# Patient Record
Sex: Male | Born: 1965
Health system: Southern US, Community
[De-identification: ages and names within clinical notes are randomized; demographics above are authoritative.]

## PROBLEM LIST (undated history)

## (undated) DIAGNOSIS — E079 Disorder of thyroid, unspecified: Secondary | ICD-10-CM

## (undated) DIAGNOSIS — T783XXA Angioneurotic edema, initial encounter: Secondary | ICD-10-CM

## (undated) DIAGNOSIS — M1990 Unspecified osteoarthritis, unspecified site: Secondary | ICD-10-CM

## (undated) DIAGNOSIS — E162 Hypoglycemia, unspecified: Secondary | ICD-10-CM

## (undated) DIAGNOSIS — I10 Essential (primary) hypertension: Secondary | ICD-10-CM

## (undated) DIAGNOSIS — M545 Low back pain, unspecified: Secondary | ICD-10-CM

## (undated) DIAGNOSIS — T7840XA Allergy, unspecified, initial encounter: Secondary | ICD-10-CM

## (undated) HISTORY — DX: Angioneurotic edema, initial encounter: T78.3XXA

## (undated) HISTORY — DX: Low back pain, unspecified: M54.50

## (undated) HISTORY — PX: OTHER SURGICAL HISTORY: SHX169

## (undated) HISTORY — DX: Essential (primary) hypertension: I10

## (undated) HISTORY — DX: Low back pain: M54.5

## (undated) HISTORY — DX: Hypoglycemia, unspecified: E16.2

## (undated) HISTORY — DX: Allergy, unspecified, initial encounter: T78.40XA

## (undated) HISTORY — DX: Unspecified osteoarthritis, unspecified site: M19.90

---

## 1998-05-23 ENCOUNTER — Emergency Department (HOSPITAL_COMMUNITY): Admission: EM | Admit: 1998-05-23 | Discharge: 1998-05-23 | Payer: Self-pay | Admitting: Emergency Medicine

## 1999-04-18 ENCOUNTER — Emergency Department (HOSPITAL_COMMUNITY): Admission: EM | Admit: 1999-04-18 | Discharge: 1999-04-18 | Payer: Self-pay | Admitting: Emergency Medicine

## 1999-04-18 ENCOUNTER — Encounter: Payer: Self-pay | Admitting: Emergency Medicine

## 2001-07-03 ENCOUNTER — Emergency Department (HOSPITAL_COMMUNITY): Admission: EM | Admit: 2001-07-03 | Discharge: 2001-07-03 | Payer: Self-pay | Admitting: *Deleted

## 2002-03-11 ENCOUNTER — Emergency Department (HOSPITAL_COMMUNITY): Admission: EM | Admit: 2002-03-11 | Discharge: 2002-03-11 | Payer: Self-pay | Admitting: Emergency Medicine

## 2002-08-21 ENCOUNTER — Emergency Department (HOSPITAL_COMMUNITY): Admission: EM | Admit: 2002-08-21 | Discharge: 2002-08-21 | Payer: Self-pay | Admitting: Emergency Medicine

## 2003-01-13 ENCOUNTER — Encounter: Payer: Self-pay | Admitting: Emergency Medicine

## 2003-01-13 ENCOUNTER — Emergency Department (HOSPITAL_COMMUNITY): Admission: EM | Admit: 2003-01-13 | Discharge: 2003-01-13 | Payer: Self-pay | Admitting: Emergency Medicine

## 2003-08-10 ENCOUNTER — Emergency Department (HOSPITAL_COMMUNITY): Admission: EM | Admit: 2003-08-10 | Discharge: 2003-08-10 | Payer: Self-pay | Admitting: Emergency Medicine

## 2003-08-16 ENCOUNTER — Encounter: Admission: RE | Admit: 2003-08-16 | Discharge: 2003-09-06 | Payer: Self-pay | Admitting: Family Medicine

## 2003-09-08 ENCOUNTER — Encounter: Payer: Self-pay | Admitting: Family Medicine

## 2003-09-08 ENCOUNTER — Ambulatory Visit (HOSPITAL_COMMUNITY): Admission: RE | Admit: 2003-09-08 | Discharge: 2003-09-08 | Payer: Self-pay | Admitting: Family Medicine

## 2005-01-04 ENCOUNTER — Ambulatory Visit: Payer: Self-pay | Admitting: Family Medicine

## 2005-01-05 ENCOUNTER — Ambulatory Visit: Payer: Self-pay | Admitting: Family Medicine

## 2005-06-07 ENCOUNTER — Ambulatory Visit: Payer: Self-pay | Admitting: Family Medicine

## 2005-07-13 ENCOUNTER — Ambulatory Visit: Payer: Self-pay | Admitting: Family Medicine

## 2005-07-28 ENCOUNTER — Encounter (INDEPENDENT_AMBULATORY_CARE_PROVIDER_SITE_OTHER): Payer: Self-pay | Admitting: Specialist

## 2005-07-28 ENCOUNTER — Ambulatory Visit (HOSPITAL_COMMUNITY): Admission: RE | Admit: 2005-07-28 | Discharge: 2005-07-28 | Payer: Self-pay | Admitting: Surgery

## 2006-07-26 ENCOUNTER — Ambulatory Visit: Payer: Self-pay | Admitting: Family Medicine

## 2007-08-25 ENCOUNTER — Ambulatory Visit (HOSPITAL_COMMUNITY): Admission: RE | Admit: 2007-08-25 | Discharge: 2007-08-25 | Payer: Self-pay | Admitting: General Surgery

## 2007-08-25 ENCOUNTER — Encounter (HOSPITAL_BASED_OUTPATIENT_CLINIC_OR_DEPARTMENT_OTHER): Payer: Self-pay | Admitting: General Surgery

## 2008-01-03 ENCOUNTER — Ambulatory Visit: Payer: Self-pay | Admitting: Family Medicine

## 2008-01-03 DIAGNOSIS — M545 Low back pain: Secondary | ICD-10-CM

## 2008-01-03 DIAGNOSIS — S060X9A Concussion with loss of consciousness of unspecified duration, initial encounter: Secondary | ICD-10-CM | POA: Insufficient documentation

## 2008-01-03 DIAGNOSIS — I10 Essential (primary) hypertension: Secondary | ICD-10-CM

## 2008-01-03 DIAGNOSIS — S060XAA Concussion with loss of consciousness status unknown, initial encounter: Secondary | ICD-10-CM | POA: Insufficient documentation

## 2008-01-03 DIAGNOSIS — E162 Hypoglycemia, unspecified: Secondary | ICD-10-CM

## 2008-01-04 ENCOUNTER — Telehealth: Payer: Self-pay | Admitting: Family Medicine

## 2008-01-05 LAB — CONVERTED CEMR LAB
ALT: 22 units/L (ref 0–53)
AST: 19 units/L (ref 0–37)
Albumin: 4 g/dL (ref 3.5–5.2)
Alkaline Phosphatase: 47 units/L (ref 39–117)
BUN: 5 mg/dL — ABNORMAL LOW (ref 6–23)
Basophils Absolute: 0 10*3/uL (ref 0.0–0.1)
Basophils Relative: 0.3 % (ref 0.0–1.0)
Bilirubin, Direct: 0.1 mg/dL (ref 0.0–0.3)
CO2: 30 meq/L (ref 19–32)
Calcium: 9.6 mg/dL (ref 8.4–10.5)
Chloride: 105 meq/L (ref 96–112)
Creatinine, Ser: 1.1 mg/dL (ref 0.4–1.5)
Eosinophils Absolute: 0.2 10*3/uL (ref 0.0–0.6)
Eosinophils Relative: 2.7 % (ref 0.0–5.0)
GFR calc Af Amer: 94 mL/min
GFR calc non Af Amer: 78 mL/min
Glucose, Bld: 108 mg/dL — ABNORMAL HIGH (ref 70–99)
HCT: 42.6 % (ref 39.0–52.0)
Hemoglobin: 13.8 g/dL (ref 13.0–17.0)
Hgb A1c MFr Bld: 6.2 % — ABNORMAL HIGH (ref 4.6–6.0)
Lymphocytes Relative: 46.5 % — ABNORMAL HIGH (ref 12.0–46.0)
MCHC: 32.3 g/dL (ref 30.0–36.0)
MCV: 82.5 fL (ref 78.0–100.0)
Monocytes Absolute: 0.5 10*3/uL (ref 0.2–0.7)
Monocytes Relative: 7.3 % (ref 3.0–11.0)
Neutro Abs: 3.2 10*3/uL (ref 1.4–7.7)
Neutrophils Relative %: 43.2 % (ref 43.0–77.0)
Platelets: 218 10*3/uL (ref 150–400)
Potassium: 4 meq/L (ref 3.5–5.1)
RBC: 5.16 M/uL (ref 4.22–5.81)
RDW: 12.9 % (ref 11.5–14.6)
Sodium: 143 meq/L (ref 135–145)
TSH: 0.99 microintl units/mL (ref 0.35–5.50)
Total Bilirubin: 0.7 mg/dL (ref 0.3–1.2)
Total Protein: 6.9 g/dL (ref 6.0–8.3)
WBC: 7.4 10*3/uL (ref 4.5–10.5)

## 2008-06-04 ENCOUNTER — Telehealth: Payer: Self-pay | Admitting: Internal Medicine

## 2008-06-05 ENCOUNTER — Emergency Department (HOSPITAL_COMMUNITY): Admission: EM | Admit: 2008-06-05 | Discharge: 2008-06-05 | Payer: Self-pay | Admitting: Emergency Medicine

## 2008-06-12 ENCOUNTER — Ambulatory Visit: Payer: Self-pay | Admitting: Family Medicine

## 2008-06-12 DIAGNOSIS — M129 Arthropathy, unspecified: Secondary | ICD-10-CM | POA: Insufficient documentation

## 2008-06-13 ENCOUNTER — Ambulatory Visit: Payer: Self-pay | Admitting: Family Medicine

## 2008-06-17 ENCOUNTER — Telehealth: Payer: Self-pay | Admitting: Family Medicine

## 2008-06-17 LAB — CONVERTED CEMR LAB
ALT: 74 units/L — ABNORMAL HIGH (ref 0–53)
AST: 32 units/L (ref 0–37)
Albumin: 3.4 g/dL — ABNORMAL LOW (ref 3.5–5.2)
Alkaline Phosphatase: 44 units/L (ref 39–117)
Anti Nuclear Antibody(ANA): NEGATIVE
BUN: 10 mg/dL (ref 6–23)
Basophils Absolute: 0.1 10*3/uL (ref 0.0–0.1)
Basophils Relative: 1 % (ref 0.0–3.0)
Bilirubin, Direct: 0.1 mg/dL (ref 0.0–0.3)
CO2: 31 meq/L (ref 19–32)
CRP, High Sensitivity: 8 — ABNORMAL HIGH (ref 0.00–5.00)
Calcium: 8.8 mg/dL (ref 8.4–10.5)
Chloride: 102 meq/L (ref 96–112)
Creatinine, Ser: 1.1 mg/dL (ref 0.4–1.5)
Eosinophils Absolute: 0.4 10*3/uL (ref 0.0–0.7)
Eosinophils Relative: 4.2 % (ref 0.0–5.0)
GFR calc Af Amer: 94 mL/min
GFR calc non Af Amer: 78 mL/min
Glucose, Bld: 90 mg/dL (ref 70–99)
HCT: 39.4 % (ref 39.0–52.0)
Hemoglobin: 13.3 g/dL (ref 13.0–17.0)
Lymphocytes Relative: 23.2 % (ref 12.0–46.0)
MCHC: 33.6 g/dL (ref 30.0–36.0)
MCV: 81.4 fL (ref 78.0–100.0)
Monocytes Absolute: 0.5 10*3/uL (ref 0.1–1.0)
Monocytes Relative: 5.6 % (ref 3.0–12.0)
Neutro Abs: 5.7 10*3/uL (ref 1.4–7.7)
Neutrophils Relative %: 66 % (ref 43.0–77.0)
Platelets: 336 10*3/uL (ref 150–400)
Potassium: 3.8 meq/L (ref 3.5–5.1)
RBC: 4.84 M/uL (ref 4.22–5.81)
RDW: 12.6 % (ref 11.5–14.6)
Rheumatoid fact SerPl-aCnc: <20 intl units/mL — ABNORMAL LOW (ref 0.0–20.0)
Sed Rate: 9 mm/hr (ref 0–16)
Sodium: 140 meq/L (ref 135–145)
TSH: 1.2 microintl units/mL (ref 0.35–5.50)
Total Bilirubin: 0.5 mg/dL (ref 0.3–1.2)
Total Protein: 6.6 g/dL (ref 6.0–8.3)
WBC: 8.7 10*3/uL (ref 4.5–10.5)

## 2008-07-15 ENCOUNTER — Ambulatory Visit: Payer: Self-pay | Admitting: Family Medicine

## 2009-01-20 ENCOUNTER — Telehealth: Payer: Self-pay | Admitting: Family Medicine

## 2009-06-10 ENCOUNTER — Telehealth: Payer: Self-pay | Admitting: Family Medicine

## 2010-03-30 ENCOUNTER — Ambulatory Visit: Payer: Self-pay | Admitting: Family Medicine

## 2010-03-31 LAB — CONVERTED CEMR LAB
ALT: 21 units/L (ref 0–53)
AST: 22 units/L (ref 0–37)
Albumin: 4.3 g/dL (ref 3.5–5.2)
Alkaline Phosphatase: 44 units/L (ref 39–117)
BUN: 8 mg/dL (ref 6–23)
Basophils Absolute: 0.1 10*3/uL (ref 0.0–0.1)
Basophils Relative: 0.8 % (ref 0.0–3.0)
Bilirubin, Direct: 0.1 mg/dL (ref 0.0–0.3)
CO2: 30 meq/L (ref 19–32)
Calcium: 9.4 mg/dL (ref 8.4–10.5)
Chloride: 103 meq/L (ref 96–112)
Cholesterol: 208 mg/dL — ABNORMAL HIGH (ref 0–200)
Creatinine, Ser: 1.1 mg/dL (ref 0.4–1.5)
Direct LDL: 107.5 mg/dL
Eosinophils Absolute: 0.2 10*3/uL (ref 0.0–0.7)
Eosinophils Relative: 2.8 % (ref 0.0–5.0)
GFR calc non Af Amer: 90.49 mL/min (ref >60)
Glucose, Bld: 97 mg/dL (ref 70–99)
HCT: 42.6 % (ref 39.0–52.0)
HDL: 77.9 mg/dL (ref >39.00)
Hemoglobin: 14 g/dL (ref 13.0–17.0)
Lymphocytes Relative: 42.3 % (ref 12.0–46.0)
Lymphs Abs: 3.1 10*3/uL (ref 0.7–4.0)
MCHC: 32.8 g/dL (ref 30.0–36.0)
MCV: 82.8 fL (ref 78.0–100.0)
Monocytes Absolute: 0.5 10*3/uL (ref 0.1–1.0)
Monocytes Relative: 7 % (ref 3.0–12.0)
Neutro Abs: 3.4 10*3/uL (ref 1.4–7.7)
Neutrophils Relative %: 47.1 % (ref 43.0–77.0)
PSA: 0.64 ng/mL (ref 0.10–4.00)
Platelets: 226 10*3/uL (ref 150.0–400.0)
Potassium: 4 meq/L (ref 3.5–5.1)
RBC: 5.15 M/uL (ref 4.22–5.81)
RDW: 14.3 % (ref 11.5–14.6)
Sodium: 141 meq/L (ref 135–145)
TSH: 0.99 microintl units/mL (ref 0.35–5.50)
Total Bilirubin: 0.6 mg/dL (ref 0.3–1.2)
Total CHOL/HDL Ratio: 3
Total Protein: 7.6 g/dL (ref 6.0–8.3)
Triglycerides: 55 mg/dL (ref 0.0–149.0)
VLDL: 11 mg/dL (ref 0.0–40.0)
WBC: 7.3 10*3/uL (ref 4.5–10.5)

## 2010-05-14 ENCOUNTER — Ambulatory Visit: Payer: Self-pay | Admitting: Family Medicine

## 2010-05-14 DIAGNOSIS — T783XXA Angioneurotic edema, initial encounter: Secondary | ICD-10-CM | POA: Insufficient documentation

## 2010-06-08 ENCOUNTER — Telehealth: Payer: Self-pay | Admitting: Family Medicine

## 2010-06-09 ENCOUNTER — Telehealth: Payer: Self-pay | Admitting: Family Medicine

## 2010-06-10 ENCOUNTER — Ambulatory Visit: Payer: Self-pay | Admitting: Family Medicine

## 2010-07-06 ENCOUNTER — Encounter: Payer: Self-pay | Admitting: Family Medicine

## 2010-09-11 ENCOUNTER — Emergency Department (HOSPITAL_COMMUNITY): Admission: EM | Admit: 2010-09-11 | Discharge: 2010-09-11 | Payer: Self-pay | Admitting: Emergency Medicine

## 2010-12-15 NOTE — Assessment & Plan Note (Signed)
Summary: swelling of mouth/dm   Vital Signs:  Patient profile:   45 year old male Weight:      189 pounds Temp:     98.5 degrees F oral BP sitting:   140 / 108  (left arm) Cuff size:   regular  Vitals Entered By: Raechel Ache, RN (May 14, 2010 9:44 AM) CC: C/o lips swelling past 3 weeks @different  times. Taking Benadryl and it helps. Hands also swelling.   History of Present Illness: Here for 3 episodes in the past week of swelling in the lips. he had some mild hand swelling with one of these spells, but no SOB or wheezing. One time it happened after eating some avocado chips, one time after eating a can of fruit salad, and one time after having only coffee. No other new exposures that he knows of, no new medications. Each of these times he took some Benadryl, and the swelling promptly resolved.   Allergies: No Known Drug Allergies  Past History:  Past Medical History: Reviewed history from 07/15/2008 and no changes required. Hypertension hx of concussion Fx rt collarbone Low back pain non-rheumatoid inflammatory arthritis  Review of Systems  The patient denies anorexia, fever, weight loss, weight gain, vision loss, decreased hearing, hoarseness, chest pain, syncope, dyspnea on exertion, prolonged cough, headaches, hemoptysis, abdominal pain, melena, hematochezia, severe indigestion/heartburn, hematuria, incontinence, genital sores, muscle weakness, suspicious skin lesions, transient blindness, difficulty walking, depression, unusual weight change, abnormal bleeding, enlarged lymph nodes, angioedema, breast masses, and testicular masses.    Physical Exam  General:  Well-developed,well-nourished,in no acute distress; alert,appropriate and cooperative throughout examination Head:  Normocephalic and atraumatic without obvious abnormalities. No apparent alopecia or balding. Eyes:  No corneal or conjunctival inflammation noted. EOMI. Perrla. Funduscopic exam benign, without  hemorrhages, exudates or papilledema. Vision grossly normal. Ears:  External ear exam shows no significant lesions or deformities.  Otoscopic examination reveals clear canals, tympanic membranes are intact bilaterally without bulging, retraction, inflammation or discharge. Hearing is grossly normal bilaterally. Nose:  External nasal examination shows no deformity or inflammation. Nasal mucosa are pink and moist without lesions or exudates. Mouth:  Oral mucosa and oropharynx without lesions or exudates.  Teeth in good repair. Neck:  No deformities, masses, or tenderness noted. Lungs:  Normal respiratory effort, chest expands symmetrically. Lungs are clear to auscultation, no crackles or wheezes. Heart:  Normal rate and regular rhythm. S1 and S2 normal without gallop, murmur, click, rub or other extra sounds. Extremities:  no edema Skin:  Intact without suspicious lesions or rashes   Impression & Recommendations:  Problem # 1:  ANGIOEDEMA (ICD-995.1)  Complete Medication List: 1)  Flexeril 10 Mg Tabs (Cyclobenzaprine hcl) .... Three times a day as needed spasm 2)  Skelaxin 800 Mg Tabs (Metaxalone) .[name].. 1 every 6 hours as needed spasm 3)  Prednisone (pak) 10 Mg Tabs (Prednisone) .... As directed for 12 days  Patient Instructions: 1)  Given a 12 day prednisone taper pack. He will take Zyrtec two times a day for 30 days.  2)  Please schedule a follow-up appointment as needed .  Prescriptions: PREDNISONE (PAK) 10 MG TABS (PREDNISONE) as directed for 12 days  #1 x 0   Entered and Authorized by:   Nelwyn Salisbury MD   Signed by:   Nelwyn Salisbury MD on 05/14/2010   Method used:   Electronically to        [facility] [phone]* (retail)  [address]       [place], [place]  [address]       Ph: [phone]       Fax: [phone]   RxID:   [id number]

## 2010-12-15 NOTE — Assessment & Plan Note (Signed)
Summary: pts mouth is swelling/per Dr. Karalee Height   Vital Signs:  Patient profile:   45 year old male Weight:      190 pounds Temp:     98.1 degrees F oral BP sitting:   142 / 100  (left arm) Cuff size:   regular  Vitals Entered By: Raechel Ache, RN (June 10, 2010 10:44 AM) CC: Lips swollen again, finished prednisone and zyrtec.   History of Present Illness: For the past 6 weeks he has had recurrent swelling in his lips and face. No SOB or wheezing. We gave him a course of Prednisone, and this helped a lot. Now he is keeping it under control with Benadryl, but it will come back if he stops Benadryl for a day or two. He is on no other meds.   Allergies: No Known Drug Allergies  Past History:  Past Medical History: Reviewed history from 07/15/2008 and no changes required. Hypertension hx of concussion Fx rt collarbone Low back pain non-rheumatoid inflammatory arthritis  Review of Systems  The patient denies anorexia, fever, weight loss, weight gain, vision loss, decreased hearing, hoarseness, chest pain, syncope, dyspnea on exertion, peripheral edema, prolonged cough, headaches, hemoptysis, abdominal pain, melena, hematochezia, severe indigestion/heartburn, hematuria, incontinence, genital sores, muscle weakness, suspicious skin lesions, transient blindness, difficulty walking, depression, unusual weight change, abnormal bleeding, enlarged lymph nodes, angioedema, breast masses, and testicular masses.    Physical Exam  General:  Well-developed,well-nourished,in no acute distress; alert,appropriate and cooperative throughout examination Head:  Normocephalic and atraumatic without obvious abnormalities. No apparent alopecia or balding. Eyes:  No corneal or conjunctival inflammation noted. EOMI. Perrla. Funduscopic exam benign, without hemorrhages, exudates or papilledema. Vision grossly normal. Ears:  External ear exam shows no significant lesions or deformities.  Otoscopic  examination reveals clear canals, tympanic membranes are intact bilaterally without bulging, retraction, inflammation or discharge. Hearing is grossly normal bilaterally. Nose:  External nasal examination shows no deformity or inflammation. Nasal mucosa are pink and moist without lesions or exudates. Mouth:  Oral mucosa and oropharynx without lesions or exudates.  Teeth in good repair. Neck:  No deformities, masses, or tenderness noted. Lungs:  Normal respiratory effort, chest expands symmetrically. Lungs are clear to auscultation, no crackles or wheezes. Heart:  Normal rate and regular rhythm. S1 and S2 normal without gallop, murmur, click, rub or other extra sounds. Skin:  Intact without suspicious lesions or rashes   Impression & Recommendations:  Problem # 1:  ANGIOEDEMA (ICD-995.1)  Orders: Allergy Referral  (Allergy)  Complete Medication List: 1)  Flexeril 10 Mg Tabs (Cyclobenzaprine hcl) .... Three times a day as needed spasm 2)  Skelaxin 800 Mg Tabs (Metaxalone) .[name].. 1 every 6 hours as needed spasm  Patient Instructions: 1)  Stay on benadryl. Will refer to Allergy for testing.

## 2010-12-15 NOTE — Progress Notes (Signed)
Summary: wants ov tomorrow am  Phone Note Call from Patient Call back at Home Phone [phone] Call back at [phone]   Caller: Patient---live call Summary of Call: pt would like to be seen tomorrow am about his lips. refused his rx yesterday.  Initial call taken by: Warnell Forester,  June 09, 2010 9:34 AM  Follow-up for Phone Call        okay  Follow-up by: Nelwyn Salisbury MD,  June 09, 2010 1:24 PM  Additional Follow-up for Phone Call Additional follow up Details #1::        pt will come in tomorrow as instructed. Additional Follow-up by: Warnell Forester,  June 09, 2010 1:32 PM

## 2010-12-15 NOTE — Assessment & Plan Note (Signed)
Summary: cpx   Vital Signs:  Patient profile:   45 year old male Weight:      186 pounds Temp:     98.4 degrees F oral BP sitting:   144 / 104  (left arm) Cuff size:   regular  Vitals Entered By: Raechel Ache, RN (Mar 30, 2010 1:03 PM) CC: Wants prostate check.   History of Present Illness: 45 yr old male for a cpx. He feels fine and has no complaints.   Allergies (verified): No Known Drug Allergies  Past History:  Past Medical History: Reviewed history from 07/15/2008 and no changes required. Hypertension hx of concussion Fx rt collarbone Low back pain non-rheumatoid inflammatory arthritis  Past Surgical History: Reviewed history from 01/03/2008 and no changes required. Denies surgical history  Family History: Reviewed history from 07/11/2007 and no changes required. Family History Diabetes 1st degree relative Family History Hypertension Fam hx Breast CA  Social History: Reviewed history from 07/11/2007 and no changes required. Married Never Smoked Alcohol use-no Drug use-no  Review of Systems  The patient denies anorexia, fever, weight loss, weight gain, vision loss, decreased hearing, hoarseness, chest pain, syncope, dyspnea on exertion, peripheral edema, prolonged cough, headaches, hemoptysis, abdominal pain, melena, hematochezia, severe indigestion/heartburn, hematuria, incontinence, genital sores, muscle weakness, suspicious skin lesions, transient blindness, difficulty walking, depression, unusual weight change, abnormal bleeding, enlarged lymph nodes, angioedema, breast masses, and testicular masses.    Physical Exam  General:  Well-developed,well-nourished,in no acute distress; alert,appropriate and cooperative throughout examination Head:  Normocephalic and atraumatic without obvious abnormalities. No apparent alopecia or balding. Eyes:  No corneal or conjunctival inflammation noted. EOMI. Perrla. Funduscopic exam benign, without hemorrhages,  exudates or papilledema. Vision grossly normal. Ears:  External ear exam shows no significant lesions or deformities.  Otoscopic examination reveals clear canals, tympanic membranes are intact bilaterally without bulging, retraction, inflammation or discharge. Hearing is grossly normal bilaterally. Nose:  External nasal examination shows no deformity or inflammation. Nasal mucosa are pink and moist without lesions or exudates. Mouth:  Oral mucosa and oropharynx without lesions or exudates.  Teeth in good repair. Neck:  No deformities, masses, or tenderness noted. Chest Wall:  No deformities, masses, tenderness or gynecomastia noted. Lungs:  Normal respiratory effort, chest expands symmetrically. Lungs are clear to auscultation, no crackles or wheezes. Heart:  Normal rate and regular rhythm. S1 and S2 normal without gallop, murmur, click, rub or other extra sounds. Abdomen:  Bowel sounds positive,abdomen soft and non-tender without masses, organomegaly or hernias noted. Rectal:  No external abnormalities noted. Normal sphincter tone. No rectal masses or tenderness. Genitalia:  Testes bilaterally descended without nodularity, tenderness or masses. No scrotal masses or lesions. No penis lesions or urethral discharge. Prostate:  Prostate gland firm and smooth, no enlargement, nodularity, tenderness, mass, asymmetry or induration. Msk:  No deformity or scoliosis noted of thoracic or lumbar spine.   Pulses:  R and L carotid,radial,femoral,dorsalis pedis and posterior tibial pulses are full and equal bilaterally Extremities:  No clubbing, cyanosis, edema, or deformity noted with normal full range of motion of all joints.   Neurologic:  No cranial nerve deficits noted. Station and gait are normal. Plantar reflexes are down-going bilaterally. DTRs are symmetrical throughout. Sensory, motor and coordinative functions appear intact. Skin:  Intact without suspicious lesions or rashes Cervical Nodes:  No  lymphadenopathy noted Axillary Nodes:  No palpable lymphadenopathy Inguinal Nodes:  No significant adenopathy Psych:  Cognition and judgment appear intact. Alert and cooperative with normal attention span  and concentration. No apparent delusions, illusions, hallucinations   Impression & Recommendations:  Problem # 1:  HEALTH MAINTENANCE EXAM (ICD-V70.0)  Orders: UA Dipstick w/o Micro (automated)  (81003) Venipuncture (60454) TLB-Lipid Panel (80061-LIPID) TLB-BMP (Basic Metabolic Panel-BMET) (80048-METABOL) TLB-CBC Platelet - w/Differential (85025-CBCD) TLB-Hepatic/Liver Function Pnl (80076-HEPATIC) TLB-TSH (Thyroid Stimulating Hormone) (84443-TSH) TLB-PSA (Prostate Specific Antigen) (84153-PSA)  Complete Medication List: 1)  Flexeril 10 Mg Tabs (Cyclobenzaprine hcl) .... Three times a day as needed spasm 2)  Skelaxin 800 Mg Tabs (Metaxalone) .[name].. 1 every 6 hours as needed spasm  Patient Instructions: 1)  Get labs. Recheck the BP in one month Prescriptions: FLEXERIL 10 MG  TABS (CYCLOBENZAPRINE HCL) three times a day as needed spasm  #60 x 11   Entered and Authorized by:   Nelwyn Salisbury MD   Signed by:   Nelwyn Salisbury MD on 03/30/2010   Method used:   Electronically to        [facility] [id number]* (retail)       [address] [place], [place]  [address]       Ph: [phone]       Fax: [phone]   RxID:   [id number]

## 2010-12-15 NOTE — Progress Notes (Signed)
Summary: refill request  Phone Note Call from Patient Call back at [phone]   Caller: Patient Call For: Nelwyn Salisbury MD Reason for Call: Talk to Doctor Details for Reason: refill Summary of Call: patient is calling because he is now out of his rx for prednisone and zyrtec.  His lips are still swollen and he would like  a refill.  [facility].  Follow-up for Phone Call        refill a 12 day taper pack of 10 mg prednisone Follow-up by: Nelwyn Salisbury MD,  June 08, 2010 5:13 PM  Additional Follow-up for Phone Call Additional follow up Details #1::        Rx Called In Additional Follow-up by: Raechel Ache, RN,  June 08, 2010 5:16 PM

## 2010-12-15 NOTE — Consult Note (Signed)
Summary: [facility]  [facility]   Imported By: Maryln Gottron 08/04/2010 13:43:29  _____________________________________________________________________  External Attachment:    Type:   Image     Comment:   External Document

## 2010-12-28 ENCOUNTER — Telehealth: Payer: Self-pay | Admitting: *Deleted

## 2010-12-28 NOTE — Telephone Encounter (Signed)
Has a sinus infection. Would like an abx called to [facility].

## 2010-12-28 NOTE — Telephone Encounter (Signed)
Pt is asking for an antibiotic for a sinus infection, congestion, postnasal drainage and facial pain?? Cannot reach by either phone number.

## 2010-12-28 NOTE — Telephone Encounter (Signed)
Appt scheduled tomorrow

## 2010-12-28 NOTE — Telephone Encounter (Signed)
He would need to be seen

## 2010-12-29 ENCOUNTER — Ambulatory Visit (INDEPENDENT_AMBULATORY_CARE_PROVIDER_SITE_OTHER): Payer: 59 | Admitting: Family Medicine

## 2010-12-29 ENCOUNTER — Encounter: Payer: Self-pay | Admitting: Family Medicine

## 2010-12-29 VITALS — BP 144/98 | HR 98 | Temp 98.7°F | Ht 69.0 in | Wt 196.0 lb

## 2010-12-29 DIAGNOSIS — J329 Chronic sinusitis, unspecified: Secondary | ICD-10-CM

## 2010-12-29 MED ORDER — AZITHROMYCIN 250 MG PO TABS: ORAL_TABLET | ORAL | Status: AC

## 2010-12-29 NOTE — Progress Notes (Signed)
  Subjective:    Patient ID: William Novak, male    DOB: 1965/12/15, 45 y.o.   MRN: [id number]  HPI Here for one week of sinus pressure, PND, HA, ST, and a dry cough. No fever. Tried some Mucinex D.   Review of Systems  Constitutional: Negative.   HENT: Positive for congestion, sore throat, postnasal drip and sinus pressure.   Eyes: Negative.   Respiratory: Positive for cough.        Objective:   Physical Exam  Constitutional: He appears well-developed and well-nourished.  HENT:  Head: Normocephalic and atraumatic.  Right Ear: External ear normal.  Left Ear: External ear normal.  Nose: Nose normal.  Mouth/Throat: Oropharynx is clear and moist. No oropharyngeal exudate.  Eyes: Conjunctivae and EOM are normal. Pupils are equal, round, and reactive to light. Right eye exhibits no discharge. Left eye exhibits no discharge.  Neck: Normal range of motion. Neck supple.  Pulmonary/Chest: Effort normal and breath sounds normal. No respiratory distress. He has no wheezes. He has no rales. He exhibits no tenderness.  Lymphadenopathy:    He has no cervical adenopathy.          Assessment & Plan:  Has an early sinus infection

## 2011-03-30 NOTE — Op Note (Signed)
NAME:  William Novak, William Novak              ACCOUNT NO.:  [id number]   MEDICAL RECORD NO.:  [id number]          PATIENT TYPE:  AMB   LOCATION:  DAY                          FACILITY:  [facility]   PHYSICIAN:  Leonie Man, M.D.   DATE OF BIRTH:  [birth year]   DATE OF PROCEDURE:  08/25/2007  DATE OF DISCHARGE:                               OPERATIVE REPORT   PREOPERATIVE DIAGNOSIS:  1. Anal fistula.  2. Hemorrhoidal disease.   POSTOPERATIVE DIAGNOSIS:  1. Anal fistula.  2. Hemorrhoidal disease.   OPERATION/PROCEDURE:  Anal fistulotomy and hemorrhoidectomy.   SURGEON:  Leonie Man, M.D.   ASSISTANT:  OR tech.   ANESTHESIA:  General.   SPECIMENS:  To lab hemorrhoids   FINDINGS:  Superficial transsphincteric anal fistula and multiple  hemorrhoidal tags.   ESTIMATED BLOOD LOSS:  Minimal.   COMPLICATIONS:  None apparent.   DISPOSITION:  The patient returned to the PACU in excellent condition.   INDICATIONS:  The patient is a 45 year old man who is status post  drainage of a perianal abscess.  He has the fistula.  He comes to the  operating room now for anal fistulotomy.   DESCRIPTION OF PROCEDURE:  The patient is positioned in lithotomy  position following induction of satisfactory general anesthesia.  Positive identification of the patient as [name] is carried out.  Perianal tissues are prepped and draped to be included in a sterile  operative field.  I placed a probe into the fistula which is located at  the 8 o'clock axis with the patient in lithotomy and then injected  through a 18-gauge Angiocath some peroxide into the fistulous tract.  This showed its entry point at the mid testate line.  The probe was  reinserted and brought out through the internal opening and cut down on  the tract using electrocautery.  The tract was then scraped clean and  hemostasis obtained on the edges with electrocautery.  A running  marsupializing suture of 3-0 chromic catgut securing the  mucosal into  the base of the fistulous tract.   I then turned attention to multiple hemorrhoidal tags.  These were  infiltrated with 0.25% Marcaine with epinephrine.  These were excised  and closed with running sutures of 3-0 chromic catgut.  A total of three  of these were removed and forwarded for pathologic evaluation.  Sponge  and instrument and sharp counts were verified.  I placed a Xeroform  packing into the marsupialized fistula and I placed a Gelfoam roll  inside the anus for additional hemostasis.  Sterile dressing was placed  on the wound.  Anesthetic reversed and the patient removed from the  operating room to the recovery room in stable condition.  He tolerated  the procedure well.      Leonie Man, M.D.  Electronically Signed     PB/MEDQ  D:  08/25/2007  T:  08/26/2007  Job:  [id number]

## 2011-04-02 NOTE — Op Note (Signed)
NAME:  William Novak, William Novak              ACCOUNT NO.:  [id number]   MEDICAL RECORD NO.:  [id number]          PATIENT TYPE:  AMB   LOCATION:  DAY                          FACILITY:  [facility]   PHYSICIAN:  Wilmon Arms. Corliss Skains, M.D. DATE OF BIRTH:  Mar 24, 1966   DATE OF PROCEDURE:  07/28/2005  DATE OF DISCHARGE:                                 OPERATIVE REPORT   PREOPERATIVE DIAGNOSIS:  Prolapsing internal hemorrhoids.   POSTOPERATIVE DIAGNOSIS:  Prolapsing internal hemorrhoids.   PROCEDURE PERFORMED:  Procedure for prolapsing hemorrhoids (stapled  hemorrhoidpexy).   SURGEON:  Wilmon Arms. Tsuei, M.D.   ANESTHESIA:  General endotracheal.   INDICATIONS:  Patient is a 45 year old male who has had chronic problems  with hemorrhoids.  Over the last week, he has had problems with prolapsing  internal hemorrhoid.  He was having some bleeding.  He presented to the  clinic after a visit to the emergency department.  The patient did have some  loose external hemorrhoidal skin, but I felt that his main problem was the  prolapsing internal hemorrhoid.  Therefore, I recommended the stapled  hemorrhoidpexy procedure [facility]).  The patient consented to the procedure.   DESCRIPTION OF PROCEDURE:  Patient was brought to the operating room, where  proper identification was obtained.  After an adequate level of general  endotracheal anesthesia was obtained, the patient was positioned in a prone  jack-knife position.  Appropriate padding was placed.  His buttocks were  taped apart.  The perianal region was prepped with Betadine and draped in a  sterile fashion.  We then underwent a time-out to confirm the proper  procedure and the proper patient.  The bullet retractor was then inserted  into the rectum.  The patient had some loose internal hemorrhoidal tissue  but no other masses.  The [facility] kit was then opened.  The dilator was inserted  into the rectum.  The clear perianal retractor was then inserted.  The white  working retractor was inserted through the clear retractor, and a purse-  string suture was placed in a circumferential fashion.  2-0 Prolene was used  to create the purse-string.  Once the purse-string was completed, the  stapler was inserted.  The anvil was placed above the purse-string. The  purse-string suture was then tied down around the anvil.  The tails of the  suture were passed through the side ports of the stapler, and an air knot  was tied.  The stapler was then tightened down and held for approximately 30  seconds.  This stapler closed down to approximately 3 cm above the dentate  line.  The stapler was then fired, opened, and removed.  Hemorrhoidal tissue  was then removed from the stapler and sent for pathologic examination.  The  retractor was then reinserted.  No bleeding was noted from the staple line.  The rectum was then packed with  Gelfoam soaked in lidocaine jelly.  An ABD pad was placed.  The patient was  then turned back to a supine position, extubated, and brought to the  recovery room in stable condition in stable condition.  All sponge,  instrument, and needle counts were correct.      Wilmon Arms. Tsuei, M.D.  Electronically Signed     MKT/MEDQ  D:  07/28/2005  T:  07/28/2005  Job:  [id number]

## 2011-06-02 ENCOUNTER — Encounter: Payer: Self-pay | Admitting: Family Medicine

## 2011-06-02 ENCOUNTER — Ambulatory Visit (INDEPENDENT_AMBULATORY_CARE_PROVIDER_SITE_OTHER): Payer: 59 | Admitting: Family Medicine

## 2011-06-02 DIAGNOSIS — R079 Chest pain, unspecified: Secondary | ICD-10-CM

## 2011-06-03 ENCOUNTER — Encounter: Payer: Self-pay | Admitting: Family Medicine

## 2011-06-03 NOTE — Progress Notes (Signed)
  Subjective:    Patient ID: William Novak, male    DOB: 1966/03/15, 45 y.o.   MRN: [id number]  HPI Here for 2 episodes over the past 3 days of sharp left sided chest pains which are centered at the lower rib area. No recent trauma, but he has been very active at his job lately with heavy lifting. Both of these spells started suddenly while sitting still at home, each lasted about 2 minutes, and each stopped abruptly. No associated SOB or sweats or nausea. He has never felt this before.    Review of Systems  Constitutional: Negative.   Respiratory: Negative.   Cardiovascular: Positive for chest pain. Negative for palpitations and leg swelling.       Objective:   Physical Exam  Constitutional: He appears well-developed and well-nourished.  Cardiovascular: Normal rate, regular rhythm, normal heart sounds and intact distal pulses.  Exam reveals no gallop and no friction rub.   No murmur heard.      EKG normal  Pulmonary/Chest: Effort normal and breath sounds normal. No respiratory distress. He has no wheezes. He has no rales.       He is very tender over the left anterior lower ribs, and palpating this area reproduces his pain exactly          Assessment & Plan:  This is chest wall pain, probably a costal muscle strain. He will rest, use Motrin prn. Recheck prn

## 2011-06-28 ENCOUNTER — Telehealth: Payer: Self-pay | Admitting: Family Medicine

## 2011-06-28 NOTE — Telephone Encounter (Signed)
Refill request for Cyclobenzapr 10 mg take 1 po tid prn, pt last here on 06/02/11 and script last filled on 03/29/11.

## 2011-06-28 NOTE — Telephone Encounter (Signed)
Call in #[phone] with 11 rf 

## 2011-06-28 NOTE — Telephone Encounter (Signed)
Please call to [facility].

## 2011-06-29 MED ORDER — CYCLOBENZAPRINE HCL 5 MG PO TABS: 10.0000 mg | ORAL_TABLET | Freq: Three times a day (TID) | ORAL | Status: DC | PRN

## 2011-06-29 NOTE — Telephone Encounter (Signed)
Script sent e-scribe, pt aware.

## 2011-08-26 LAB — DIFFERENTIAL
Basophils Absolute: 0
Eosinophils Absolute: 0.2
Lymphocytes Relative: 46
Lymphs Abs: 2.9
Neutrophils Relative %: 42 — ABNORMAL LOW

## 2011-08-26 LAB — CBC
HCT: 41
Hemoglobin: 13.5
MCHC: 32.9
RBC: 5.05
RDW: 13.4

## 2011-10-09 ENCOUNTER — Emergency Department (HOSPITAL_COMMUNITY)
Admission: EM | Admit: 2011-10-09 | Discharge: 2011-10-09 | Disposition: A | Discharge status: Home / Self Care | Payer: 59 | Attending: Emergency Medicine | Admitting: Emergency Medicine

## 2011-10-09 ENCOUNTER — Encounter (HOSPITAL_COMMUNITY): Payer: Self-pay | Admitting: *Deleted

## 2011-10-09 DIAGNOSIS — M129 Arthropathy, unspecified: Secondary | ICD-10-CM | POA: Insufficient documentation

## 2011-10-09 DIAGNOSIS — L089 Local infection of the skin and subcutaneous tissue, unspecified: Secondary | ICD-10-CM | POA: Insufficient documentation

## 2011-10-09 DIAGNOSIS — L02419 Cutaneous abscess of limb, unspecified: Secondary | ICD-10-CM | POA: Insufficient documentation

## 2011-10-09 DIAGNOSIS — Z79899 Other long term (current) drug therapy: Secondary | ICD-10-CM | POA: Insufficient documentation

## 2011-10-09 DIAGNOSIS — IMO0002 Reserved for concepts with insufficient information to code with codable children: Secondary | ICD-10-CM

## 2011-10-09 DIAGNOSIS — I10 Essential (primary) hypertension: Secondary | ICD-10-CM | POA: Insufficient documentation

## 2011-10-09 MED ORDER — DOXYCYCLINE HYCLATE 100 MG PO CAPS: 100.0000 mg | ORAL_CAPSULE | Freq: Two times a day (BID) | ORAL | Status: AC

## 2011-10-09 MED ORDER — IBUPROFEN 800 MG PO TABS
800.0000 mg | ORAL_TABLET | Freq: Once | ORAL | Status: AC
  Administered 2011-10-09: 800 mg via ORAL
  Filled 2011-10-09: qty 1

## 2011-10-09 MED ORDER — LIDOCAINE-EPINEPHRINE 2 %-1:100000 IJ SOLN
INTRAMUSCULAR | Status: AC
  Filled 2011-10-09: qty 1

## 2011-10-09 NOTE — ED Provider Notes (Signed)
History     CSN: [id number] Arrival date & time: 10/09/2011  9:12 AM   First MD Initiated Contact with Patient 10/09/11 0940      Chief Complaint  Patient presents with  . Insect Bite    (Consider location/radiation/quality/duration/timing/severity/associated sxs/prior treatment) HPI  Patient presents to emergency department complaining of a red draining area on his right anterior thigh he states he first noted as a "bump" a few days ago but states that 2 days ago it "opened up and began to drain." Since then patient states the area continues to drain small amount of yellow orange discharge. Patient states the area is mildly tender to the touch and he has mild tenderness when flexing his thigh. Patient denies fevers or chills. Patient states "since it still draining I figured I needed to get it checked out and to make sure it wasn't a spider bite." Patient has not been taking anything for pain stating very little associated pain. Pain is aggravated by direct touch and flexing the thigh and improved with lying still. Symptoms were gradual onset and persistent but not changing  Past Medical History  Diagnosis Date  . Hypoglycemia   . Hypertension   . Arthritis     Generalized  . Low back pain   . Concussion   . Angioedema     Past Surgical History  Procedure Date  . Fistula removal   . Hemmorhoidectomy     No family history on file.  History  Substance Use Topics  . Smoking status: Never Smoker   . Smokeless tobacco: Not on file  . Alcohol Use: No      Review of Systems  All other systems reviewed and are negative.    Allergies  Review of patient's allergies indicates no known allergies.  Home Medications   Current Outpatient Rx  Name Route Sig Dispense Refill  . CETIRIZINE-PSEUDOEPHEDRINE 5-120 MG PO TB12 Oral Take 1 tablet by mouth as needed.      . CYCLOBENZAPRINE HCL 5 MG PO TABS Oral Take 2 tablets (10 mg total) by mouth 3 (three) times daily as needed.  90 tablet 11    BP 149/89  Pulse 90  Temp(Src) 99.1 F (37.3 C) (Oral)  Resp 18  SpO2 99%  Physical Exam  Nursing note and vitals reviewed. Constitutional: He is oriented to person, place, and time. He appears well-developed and well-nourished. No distress.  HENT:  Head: Normocephalic and atraumatic.  Eyes: Conjunctivae are normal.  Neck: Normal range of motion.  Cardiovascular: Normal rate.   Pulmonary/Chest: Effort normal.  Abdominal: Soft. He exhibits no distension. There is no tenderness.  Musculoskeletal: Normal range of motion. He exhibits edema and tenderness.       See skin exam. Mild tenderness to palpation around skin is ulceration the remainder of thigh completely nontender. No crepitus or tenderness to palpation of posterior or lateral thigh or area of thigh distal or proximal to be 2 x 2 centimeter area of redness. Full range of motion of thigh without pain.  Neurological: He is alert and oriented to person, place, and time.  Skin: Skin is warm and dry. No rash noted. He is not diaphoretic. No erythema.       2 x 2 centimeter area of faint erythema and firmness of right anterior thigh with mild tenderness to palpation in scant blood-tinged discharge in that area of 0.5 cm ulceration in the center. No crepitus.  Psychiatric: He has a normal mood and affect.  ED Course  Procedures (including critical care time)  PO ibuprofen  INCISION AND DRAINAGE Performed by: Jenness Corner Consent: Verbal consent obtained. Risks and benefits: risks, benefits and alternatives were discussed Type: abscess  Body area: right anterior thigh  Anesthesia: local infiltration  Local anesthetic: lidocaine 2% with epinephrine  Anesthetic total: 6 ml  Complexity: complex Blunt dissection to break up loculations  Drainage: blood tinged with scant purulent  Drainage amount: scant  Packing material: none  Patient tolerance: Patient tolerated the procedure well with no  immediate complications.     Labs Reviewed - No data to display No results found.   1. Abscess or cellulitis of thigh       MDM  2 x 2 centimeter area of firm erythema with mild tenderness to palpation with only scant discharge with incision and drainage with small area of associated cellulitis but no crepitus or signs or symptoms worrisome for necrotizing fasciitis or deeper infection. Proximal and distal to anterior thigh at site of infection are nontender as well as entire posterior and lateral thigh. Patient is afebrile and complaining of only mild pain with ROM of thigh.         Jenness Corner, PA 10/09/11 1033  Jenness Corner, [place] 10/09/11 1034

## 2011-10-09 NOTE — ED Provider Notes (Signed)
Medical screening examination/treatment/procedure(s) were performed by non-physician practitioner and as supervising physician I was immediately available for consultation/collaboration. Selisa Tensley, MD, FACEP   Sherma Vanmetre L Stephan Draughn, MD 10/09/11 1604 

## 2011-10-09 NOTE — ED Notes (Signed)
?   Bite to top of rt thigh, has ruptured and drained, has knot and is swollen

## 2012-03-01 ENCOUNTER — Encounter: Payer: Self-pay | Admitting: Family Medicine

## 2012-03-01 ENCOUNTER — Ambulatory Visit (INDEPENDENT_AMBULATORY_CARE_PROVIDER_SITE_OTHER): Payer: 59 | Admitting: Family Medicine

## 2012-03-01 VITALS — BP 124/80 | HR 83 | Temp 98.5°F | Wt 202.0 lb

## 2012-03-01 DIAGNOSIS — M546 Pain in thoracic spine: Secondary | ICD-10-CM

## 2012-03-01 MED ORDER — DICLOFENAC SODIUM 75 MG PO TBEC: 75.0000 mg | DELAYED_RELEASE_TABLET | Freq: Two times a day (BID) | ORAL | Status: DC

## 2012-03-01 NOTE — Progress Notes (Signed)
  Subjective:    Patient ID: Ronny Flurry, male    DOB: 1966-04-07, 46 y.o.   MRN: [id number]  HPI Here for 3 weeks of sharp pains in the upper back which radiate through the right shoulder and down the right upper arm. The right hand tingles at times. No recent trauma. Using Flexeril.    Review of Systems  Constitutional: Negative.   Musculoskeletal: Positive for back pain.       Objective:   Physical Exam  Constitutional: He appears well-developed and well-nourished.  Musculoskeletal:       Tender in the right lower cervical and right upper thoracic areas, tight in the right upper trapezius. The shoulder is fine           Assessment & Plan:  Add Diclofenac bid. He has already scheduled to see Dr. Burnis Kingfisher on 03-09-12

## 2012-07-21 ENCOUNTER — Encounter (HOSPITAL_COMMUNITY): Payer: Self-pay | Admitting: Pharmacy Technician

## 2012-07-24 ENCOUNTER — Encounter (HOSPITAL_COMMUNITY)
Admission: RE | Admit: 2012-07-24 | Discharge: 2012-07-24 | Disposition: A | Discharge status: Home / Self Care | Payer: 59 | Source: Ambulatory Visit | Attending: Physician Assistant | Admitting: Physician Assistant

## 2012-07-24 ENCOUNTER — Encounter (HOSPITAL_COMMUNITY): Payer: Self-pay

## 2012-07-24 ENCOUNTER — Encounter (HOSPITAL_COMMUNITY)
Admission: RE | Admit: 2012-07-24 | Discharge: 2012-07-24 | Disposition: A | Discharge status: Home / Self Care | Payer: 59 | Source: Ambulatory Visit | Attending: Orthopedic Surgery | Admitting: Orthopedic Surgery

## 2012-07-24 DIAGNOSIS — M5412 Radiculopathy, cervical region: Secondary | ICD-10-CM | POA: Diagnosis present

## 2012-07-24 LAB — COMPREHENSIVE METABOLIC PANEL
ALT: 27 U/L (ref 0–53)
AST: 22 U/L (ref 0–37)
Alkaline Phosphatase: 52 U/L (ref 39–117)
GFR calc Af Amer: 73 mL/min — ABNORMAL LOW (ref >90)
Glucose, Bld: 101 mg/dL — ABNORMAL HIGH (ref 70–99)
Potassium: 4.2 mEq/L (ref 3.5–5.1)
Sodium: 138 mEq/L (ref 135–145)
Total Protein: 7.8 g/dL (ref 6.0–8.3)

## 2012-07-24 LAB — CBC
HCT: 43 % (ref 39.0–52.0)
MCV: 81.6 fL (ref 78.0–100.0)
Platelets: 263 10*3/uL (ref 150–400)
RBC: 5.27 MIL/uL (ref 4.22–5.81)
WBC: 8.5 10*3/uL (ref 4.0–10.5)

## 2012-07-24 LAB — SURGICAL PCR SCREEN: Staphylococcus aureus: NEGATIVE

## 2012-07-24 NOTE — H&P (Signed)
William Novak 07/24/2012 10:12 AM Location: [facility] Patient #: [id number] DOB: 01/08/1966 Married / Language: English / Race: Black or African American Male   History of Present Illness(William Novak Dierdre Highman, PA-C; 07/24/2012 11:54 AM) The patient is a 46 year old male who comes in today for a preoperative History and Physical. The patient is scheduled for a cervical TDR vs ACDF C5-6 (for cervical radiculopathy) to be performed by Dr. Debria Garret D. Shon Baton, MD at [facility] on Wednesday, August 02, 2012 at 0830 .    Allergies(Lori W Randa Lynn; 07/24/2012 11:01 AM) No Known Drug Allergies. 03/09/2012   Family History(Ronesha Heenan J Mishael Krysiak, PA-C; 07/24/2012 1:40 PM) Congestive Heart Failure. sister Diabetes Mellitus. mother, grandmother mothers side and grandfather mothers side, mother and grandmother mothers side Drug / Alcohol Addiction. father Hypertension. grandmother mothers side, mother and grandmother mothers side Osteoarthritis. grandmother mothers side Depression. mother Liver Disease, Chronic. father Rheumatoid Arthritis. mother   Social History(Lillard Bailon J [facility], PA-C; 07/24/2012 1:40 PM) Alcohol use. current drinker; drinks beer, wine and hard liquor; only occasionally per week, former drinker Children. 3 Current work status. working full time Drug/Alcohol Rehab (Currently). no Drug/Alcohol Rehab (Previously). no Exercise. Exercises rarely; does other Illicit drug use. no Living situation. live with spouse Marital status. married Number of flights of stairs before winded. 4-5 Pain Contract. no Tobacco / smoke exposure. yes Tobacco use. never smoker   Medication History(Lori W Lamb; 07/24/2012 11:02 AM) Benadryl Allergy ( Oral prn) Specific dose unknown - Active. (prn) Norco (7.5-325MG  Tablet, 1 (one) Tablet Oral every eight hours, Taken starting 07/05/2012) Active. (called in to [facility] @ [phone]/dk/sjr) Flexeril (5MG  Tablet, 1 Oral  q 8hrs prn spasm) Active.   Past Surgical History(Kyleena Scheirer J [facility], PA-C; 07/24/2012 1:40 PM) Hemorrhoidectomy   Other Problems(Lavoris Canizales J Aahana Elza, PA-C; 07/24/2012 1:40 PM) No pertinent past medical history   Review of Systems(Somya Jauregui J Charelle Petrakis, PA-C; 07/24/2012 1:40 PM) General:Not Present- Chills, Fever, Night Sweats, Appetite Loss, Fatigue, Feeling sick, Weight Gain and Weight Loss. Skin:Not Present- Itching, Rash, Skin Color Changes, Ulcer, Psoriasis and Change in Hair or Nails. HEENT:Not Present- Sensitivity to light, Hearing problems, Nose Bleed and Ringing in the Ears. Neck:Not Present- Swollen Glands and Neck Mass. Respiratory:Not Present- Snoring, Chronic Cough, Bloody sputum and Dyspnea. Cardiovascular:Not Present- Shortness of Breath, Chest Pain, Swelling of Extremities, Leg Cramps and Palpitations. Gastrointestinal:Not Present- Bloody Stool, Heartburn, Abdominal Pain, Vomiting, Nausea and Incontinence of Stool. Male Genitourinary:Not Present- Blood in Urine, Frequency, Incontinence and Nocturia. Musculoskeletal:Present- Muscle Pain, Joint Swelling and Joint Pain. Not Present- Muscle Weakness, Joint Stiffness and Back Pain. Neurological:Present- Tingling and Numbness. Not Present- Burning, Tremor, Headaches and Dizziness. Psychiatric:Not Present- Anxiety, Depression and Memory Loss. Endocrine:Not Present- Cold Intolerance, Heat Intolerance, Excessive hunger and Excessive Thirst. Hematology:Not Present- Abnormal Bleeding, Anemia, Blood Clots and Easy Bruising.   Vitals(Lori W Lamb; 07/24/2012 11:04 AM) 07/24/2012 11:02 AM Weight: 203 lb Height: 69.5 in Body Surface Area: 2.12 m Body Mass Index: 29.55 kg/m Pulse: 78 (Regular) BP: 162/98 (Sitting, Left Arm, Standard)    Physical Exam(Nikka Hakimian J Tyshae Stair, PA-C; 07/24/2012 11:53 AM) The physical exam findings are as follows:   General General Appearance- pleasant. Not in acute distress. Orientation-  Oriented X3. Build & Nutrition- Well nourished and Well developed. Posture- Normal posture. Gait- Normal. Mental Status- Alert.   Integumentary General Characteristics:Surgical Scars- no surgical scar evidence of previous cervical surgery. Cervical Spine- Skin examination of the cervical spine is without deformity, skin lesions, lacerations or abrasions.   Head and  Neck Neck Global Assessment- supple. no lymphadenopathy and no nucchal rigidty.   Eye Pupil- Bilateral- Normal, Direct reaction to light normal, Equal and Regular. Motion- Bilateral- EOMI.   Chest and Lung Exam Auscultation: Breath sounds:- Clear.   Cardiovascular Auscultation:Rhythm- Regular rate and rhythm. Heart Sounds- Normal heart sounds.   Abdomen Palpation/Percussion:Palpation and Percussion of the abdomen reveal - Non Tender, No Rebound tenderness and Soft.   Peripheral Vascular Upper Extremity: Palpation:Radial pulse- Bilateral- 2+.   Neurologic Sensation:Upper Extremity- Left- sensation is intact in the upper extremity. Right- sensation is diminished in the upper extremity (C6 distribution). Reflexes:Biceps Reflex- Bilateral- 2+. Brachioradialis Reflex- Bilateral- 2+. Triceps Reflex- Bilateral- 2+. Babinski- Bilateral- Babinski not present. Clonus- Bilateral- clonus not present. Hoffman's Sign- Bilateral- Hoffman's sign not present.   Musculoskeletal Spine/Ribs/Pelvis Cervical Spine : Inspection and Palpation:Tenderness- no soft tissue tenderness to palpation and no bony tenderness to palpation. bony/soft tissue palpation of the cervical spine and shoulders does not recreate their typical pain. Strength and Tone: Strength:Deltoid- Bilateral- 5/5. Biceps- Bilateral- 5/5. Triceps- Bilateral- 5/5. Wrist Extension- Bilateral- 5/5. Hand Grip- Bilateral- 5/5. Heel walk- Bilateral- able to heel walk without difficulty. Toe Walk- Bilateral-  able to walk on toes without difficulty. Heel-Toe Walk- Bilateral- able to heel-toe walk without difficulty. ROM- Flexion- Mildly decreased and painful. Extension- Mildly decreased and painful. Left Rotation - Mildly decreased and painful. Right Rotation - Mildly decreased and painful. Pain:- neither flexion or extension is more painful than the other. Special Testing- axial compression test negative and cross chest impingement test negative. Non-Anatomic Signs- No non-anatomic signs present. Upper Extremity Range of Motion:- No truesholder pain with IR/ER of the shoulders.   Assessment & Plan(Lotus Santillo J [facility], PA-C; 07/24/2012 11:41 AM) Note: Unfortunately conservative measures consisting of observation, activity modification, physical therapy, oral pain medications and injections have failed to alleviate his symptoms and given the ongoing nature of his pain and the significant decrease in his overall quality of life he wishes to proceed with surgery. Risks/benefits/alternatives to the procedure/expectations following the procedure have been reviewed with the patient by Dr. Shon Baton. He understands these and that the goal of surgery is to reduce not eliminate his pain.   MRI of the cervical spine from May 2013 shows mild to moderate disc bulge without cord compression at C3-C4, C4-C5. There is severe bilateral foraminal narrowing at C5-C6 due to uncal vertebral osteophytes. No significant facet disease at 5-6. Mild changes at 6-7. At C7-T1 there is moderate to severe right foraminal stenosis. No cord signal changes.   He will complete his pre-op hospital requirements today at 1PM. He has not yet been fitted for an ASPEN collar but does have an appointment too be fitted.  All of her questions have been encouraged, addressed and answered. Plan, at this time is to proceed with surgery as scheduled.   Signed electronically by Gwinda Maine, PA-C (07/24/2012 1:40 PM)    Sharren Bridge Munar 06/19/2012 10:35 AM Location: [facility] Patient #: [id number] DOB: Dec 16, 1965 Married / Language: [place] / Race: Black or African American Male   History of Present Monico Hoar, MD; 06/19/2012 10:40 AM) The patient is a 46 year old male who presents with back pain.  Additional reason for visit:  Neck painis described as the following: The patient is seen today in referral from Dr. Shelle Iron. The patient reports symptoms involving the entire neck which began 1 year(s) ago. The symptoms began without any known injury. Symptoms include neck pain, muscle spasm, shoulder pain,  numbness and tingling. The pain radiates to the left arm and right arm. The patient describes the pain as sharp. Onset was gradual. The symptoms occur frequently.The patient describes their symptoms as moderate in severity.The patient does feel that the symptoms are worsening. Symptoms are exacerbated by neck movement. Symptoms are relieved by immobilization. Pertinent medical history includes low back pain. Prior to being seen today the patient was previously evaluated by a colleague. Past evaluation has included cervical spine x-rays and cervical spine MRI. Past treatment has included muscle relaxants, physical therapy and spinal injections.   Subjective Transcription(DAHARI Sheela Stack, MD; 06/23/2012 10:09 AM)  Consultation requested by Dr. Paula Libra for evaluation of neck and right arm pain. Orley is a very pleasant young man who about four and a half months ago started noticing significant neck and right arm pain. There was no previous injury or event that he can account for. Since the time of the onset of it he has been seeing Dr. Shelle Iron. He has had injections in his neck, physical therapy for his neck, medications and yet he continues to have significant loss of quality of life.    Allergies(DAHARI Sheela Stack, MD; 06/19/2012 10:40 AM) No Known Drug Allergies. 03/09/2012   Family  History(DAHARI D BROOKS, MD; 06/19/2012 10:40 AM) Congestive Heart Failure. sister Diabetes Mellitus. mother, grandmother mothers side and grandfather mothers side Drug / Alcohol Addiction. father Hypertension. grandmother mothers side Osteoarthritis. grandmother mothers side   Social History(DAHARI Sheela Stack, MD; 06/19/2012 10:40 AM) Alcohol use. current drinker; drinks beer, wine and hard liquor; only occasionally per week Children. 3 Current work status. working full time Drug/Alcohol Rehab (Currently). no Drug/Alcohol Rehab (Previously). no Exercise. Exercises rarely; does other Illicit drug use. no Living situation. live with spouse Marital status. married Number of flights of stairs before winded. 4-5 Pain Contract. no Tobacco / smoke exposure. yes Tobacco use. never smoker   Medication History(DAHARI D BROOKS, MD; 06/19/2012 10:40 AM) Cyclobenzaprine HCl (7.5MG  Tablet, Oral) Active. Diclofenac Sodium (75MG  Tablet DR, Oral) Active.   Past Surgical History(DAHARI Sheela Stack, MD; 06/19/2012 10:40 AM) Hemorrhoidectomy   Objective Transcription(DAHARI D BROOKS, MD; 06/23/2012 10:09 AM)  He is a pleasant gentleman who appears younger than his stated age. He is alert and oriented x3. He has pain with cervical spine range of motion. Positive Lhermitte's sign. He has 5/5 strength in the upper extremity. He does have decreased sensation in the C6 distribution on the right hand side. He has 2+ symmetrical deep tendon reflexes. Negative Babinski test. Negative Hoffman sign. No shortness of breath or chest pain. Abdomen is soft and nontender. No history of incontinence of bowel and bladder.    RADIOGRAPHS:  As the result of this on 03/31/12 a MRI of his neck was done. The MRI shows mild to moderate disc bulge without cord compression at C3-C4, C4-C5. There is severe bilateral foraminal narrowing at C5-C6 due to uncal vertebral osteophytes. No significant  facet disease at 5-6. Mild changes at 6-7. At C7-T1 there is moderate to severe right foraminal stenosis. Again, no cord signal changes.    I reviewed the MRI itself with the patient and I do agree with the radiologist findings.    Assessment & Plan(DAHARI D BROOKS, MD; 06/19/2012 11:18 AM) Pain, Cervical (723.1)  Cervical Disc Degeneration (722.4)   Assessments Transcription(DAHARI D BROOKS, MD; 06/23/2012 10:09 AM)  At this point in time, the patient has cervical spondylitic radiculopathy at C5-C6. We have discussed disc replacement surgery as well as  fusion surgery. I have reviewed the risks which include infection, bleeding, nerve damage, death, stroke, paralysis, failure to heal, need for further surgery, ongoing or worse pain, hardware complications, throat pain, swallowing difficulties, hoarseness in the voice, need for further surgery. All of his questions were addressed. I have given him some literature to review. He will review this and let me know how he would like to proceed.    Miscellaneous Transcription(DAHARI Sheela Stack, MD; 06/23/2012 10:09 AM)  Alvy Beal, MD/ljs    T: 06/21/12  D: 06/19/12    Signed electronically by Alvy Beal, MD (06/19/2012 11:33 AM)

## 2012-07-24 NOTE — Pre-Procedure Instructions (Signed)
19 William Novak  07/24/2012   Your procedure is scheduled on:  08/02/12  Report to [facility] at 630 AM.  Call this number if you have problems the morning of surgery: [phone]   Remember:   Do not eat food:After Midnight.   Take these medicines the morning of surgery with A SIP OF WATER: hydocodone   Do not wear jewelry, make-up or nail polish.  Do not wear lotions, powders, or perfumes. You may wear deodorant.  Do not shave 48 hours prior to surgery. Men may shave face and neck.  Do not bring valuables to the hospital.  Contacts, dentures or bridgework may not be worn into surgery.  Leave suitcase in the car. After surgery it may be brought to your room.  For patients admitted to the hospital, checkout time is 11:00 AM the day of discharge.   Patients discharged the day of surgery will not be allowed to drive home.  Name and phone number of your driver: family  Special Instructions: CHG Shower Use Special Wash: 1/2 bottle night before surgery and 1/2 bottle morning of surgery.   Please read over the following fact sheets that you were given: Pain Booklet, Coughing and Deep Breathing, MRSA Information and Surgical Site Infection Prevention

## 2012-08-01 MED ORDER — CEFAZOLIN SODIUM-DEXTROSE 2-3 GM-% IV SOLR
2.0000 g | INTRAVENOUS | Status: AC
  Administered 2012-08-02: 2 g via INTRAVENOUS
  Filled 2012-08-01 (×2): qty 50

## 2012-08-02 ENCOUNTER — Ambulatory Visit (HOSPITAL_COMMUNITY): Payer: 59

## 2012-08-02 ENCOUNTER — Ambulatory Visit (HOSPITAL_COMMUNITY): Payer: 59 | Admitting: Anesthesiology

## 2012-08-02 ENCOUNTER — Inpatient Hospital Stay (HOSPITAL_COMMUNITY): Payer: 59

## 2012-08-02 ENCOUNTER — Inpatient Hospital Stay (HOSPITAL_COMMUNITY)
Admission: RE | Admit: 2012-08-02 | Discharge: 2012-08-04 | DRG: 490 | Disposition: A | Discharge status: Home / Self Care | Payer: 59 | Source: Ambulatory Visit | Attending: Orthopedic Surgery | Admitting: Orthopedic Surgery

## 2012-08-02 ENCOUNTER — Encounter (HOSPITAL_COMMUNITY): Payer: Self-pay | Admitting: Anesthesiology

## 2012-08-02 ENCOUNTER — Encounter (HOSPITAL_COMMUNITY): Payer: Self-pay | Admitting: *Deleted

## 2012-08-02 ENCOUNTER — Encounter (HOSPITAL_COMMUNITY)
Admission: RE | Disposition: A | Discharge status: Home / Self Care | Payer: Self-pay | Source: Ambulatory Visit | Attending: Orthopedic Surgery

## 2012-08-02 DIAGNOSIS — M47812 Spondylosis without myelopathy or radiculopathy, cervical region: Principal | ICD-10-CM | POA: Diagnosis present

## 2012-08-02 DIAGNOSIS — M5412 Radiculopathy, cervical region: Secondary | ICD-10-CM | POA: Diagnosis present

## 2012-08-02 DIAGNOSIS — Z833 Family history of diabetes mellitus: Secondary | ICD-10-CM

## 2012-08-02 DIAGNOSIS — M5030 Other cervical disc degeneration, unspecified cervical region: Secondary | ICD-10-CM | POA: Diagnosis present

## 2012-08-02 DIAGNOSIS — Z8249 Family history of ischemic heart disease and other diseases of the circulatory system: Secondary | ICD-10-CM

## 2012-08-02 HISTORY — PX: ANTERIOR CERVICAL DECOMP/DISCECTOMY FUSION: SHX1161

## 2012-08-02 SURGERY — ANTERIOR CERVICAL DECOMPRESSION/DISCECTOMY FUSION 1 LEVEL
Anesthesia: General | Site: Spine Cervical | Wound class: Clean

## 2012-08-02 MED ORDER — DEXTROSE 5 % IV SOLN
INTRAVENOUS | Status: DC | PRN
  Administered 2012-08-02: 09:00:00 via INTRAVENOUS

## 2012-08-02 MED ORDER — PHENOL 1.4 % MT LIQD
1.0000 | OROMUCOSAL | Status: DC | PRN
  Filled 2012-08-02: qty 177

## 2012-08-02 MED ORDER — DEXAMETHASONE SODIUM PHOSPHATE 10 MG/ML IJ SOLN
10.0000 mg | Freq: Once | INTRAMUSCULAR | Status: DC
  Filled 2012-08-02: qty 1

## 2012-08-02 MED ORDER — LACTATED RINGERS IV SOLN
INTRAVENOUS | Status: DC | PRN
  Administered 2012-08-02 (×3): via INTRAVENOUS

## 2012-08-02 MED ORDER — NEOSTIGMINE METHYLSULFATE 1 MG/ML IJ SOLN
INTRAMUSCULAR | Status: DC | PRN
  Administered 2012-08-02: 5 mg via INTRAVENOUS

## 2012-08-02 MED ORDER — ONDANSETRON HCL 4 MG/2ML IJ SOLN: 4.0000 mg | Freq: Once | INTRAMUSCULAR | Status: DC | PRN

## 2012-08-02 MED ORDER — ONDANSETRON HCL 4 MG/2ML IJ SOLN: 4.0000 mg | INTRAMUSCULAR | Status: DC | PRN

## 2012-08-02 MED ORDER — GLYCOPYRROLATE 0.2 MG/ML IJ SOLN
INTRAMUSCULAR | Status: DC | PRN
  Administered 2012-08-02: .6 mg via INTRAVENOUS

## 2012-08-02 MED ORDER — LACTATED RINGERS IV SOLN
INTRAVENOUS | Status: DC
Start: 2012-08-02 — End: 2012-08-04
  Administered 2012-08-02 – 2012-08-04 (×3): via INTRAVENOUS

## 2012-08-02 MED ORDER — OXYCODONE HCL 5 MG PO TABS: 5.0000 mg | ORAL_TABLET | Freq: Once | ORAL | Status: DC | PRN

## 2012-08-02 MED ORDER — FENTANYL CITRATE 0.05 MG/ML IJ SOLN
INTRAMUSCULAR | Status: DC | PRN
  Administered 2012-08-02: 50 ug via INTRAVENOUS
  Administered 2012-08-02: 100 ug via INTRAVENOUS
  Administered 2012-08-02: 50 ug via INTRAVENOUS
  Administered 2012-08-02: 100 ug via INTRAVENOUS
  Administered 2012-08-02 (×2): 50 ug via INTRAVENOUS
  Administered 2012-08-02: 100 ug via INTRAVENOUS

## 2012-08-02 MED ORDER — MORPHINE SULFATE 2 MG/ML IJ SOLN
1.0000 mg | INTRAMUSCULAR | Status: DC | PRN
  Administered 2012-08-02 (×2): 2 mg via INTRAVENOUS
  Filled 2012-08-02 (×2): qty 1

## 2012-08-02 MED ORDER — PROPOFOL 10 MG/ML IV BOLUS
INTRAVENOUS | Status: DC | PRN
  Administered 2012-08-02: 200 mg via INTRAVENOUS

## 2012-08-02 MED ORDER — BUPIVACAINE-EPINEPHRINE PF 0.25-1:200000 % IJ SOLN
INTRAMUSCULAR | Status: AC
  Filled 2012-08-02: qty 30

## 2012-08-02 MED ORDER — SODIUM CHLORIDE 0.9 % IV SOLN: 250.0000 mL | INTRAVENOUS | Status: DC

## 2012-08-02 MED ORDER — LACTATED RINGERS IV SOLN: INTRAVENOUS | Status: DC

## 2012-08-02 MED ORDER — MENTHOL 3 MG MT LOZG
1.0000 | LOZENGE | OROMUCOSAL | Status: DC | PRN
  Filled 2012-08-02: qty 9

## 2012-08-02 MED ORDER — ACETAMINOPHEN 10 MG/ML IV SOLN
1000.0000 mg | Freq: Once | INTRAVENOUS | Status: AC
  Administered 2012-08-02: 1000 mg via INTRAVENOUS
  Filled 2012-08-02: qty 100

## 2012-08-02 MED ORDER — CEFAZOLIN SODIUM 1-5 GM-% IV SOLN
1.0000 g | Freq: Three times a day (TID) | INTRAVENOUS | Status: AC
  Administered 2012-08-02 (×2): 1 g via INTRAVENOUS
  Filled 2012-08-02 (×3): qty 50

## 2012-08-02 MED ORDER — OXYCODONE HCL 5 MG/5ML PO SOLN: 5.0000 mg | Freq: Once | ORAL | Status: DC | PRN

## 2012-08-02 MED ORDER — DEXAMETHASONE SODIUM PHOSPHATE 4 MG/ML IJ SOLN
4.0000 mg | Freq: Four times a day (QID) | INTRAMUSCULAR | Status: DC
  Administered 2012-08-02 – 2012-08-04 (×5): 4 mg via INTRAVENOUS
  Filled 2012-08-02 (×12): qty 1

## 2012-08-02 MED ORDER — HYDROMORPHONE HCL PF 1 MG/ML IJ SOLN: 0.2500 mg | INTRAMUSCULAR | Status: DC | PRN

## 2012-08-02 MED ORDER — PHENYLEPHRINE HCL 10 MG/ML IJ SOLN
INTRAMUSCULAR | Status: DC | PRN
  Administered 2012-08-02: 120 ug via INTRAVENOUS
  Administered 2012-08-02: 80 ug via INTRAVENOUS

## 2012-08-02 MED ORDER — SODIUM CHLORIDE 0.9 % IJ SOLN: 3.0000 mL | Freq: Two times a day (BID) | INTRAMUSCULAR | Status: DC

## 2012-08-02 MED ORDER — OXYCODONE HCL 5 MG PO TABS
10.0000 mg | ORAL_TABLET | ORAL | Status: DC | PRN
  Administered 2012-08-03: 5 mg via ORAL
  Administered 2012-08-03 – 2012-08-04 (×2): 10 mg via ORAL
  Filled 2012-08-02 (×3): qty 2
  Filled 2012-08-02: qty 1

## 2012-08-02 MED ORDER — THROMBIN 20000 UNITS EX KIT
PACK | CUTANEOUS | Status: DC | PRN
  Administered 2012-08-02: 09:00:00 via TOPICAL

## 2012-08-02 MED ORDER — ACETAMINOPHEN 10 MG/ML IV SOLN
INTRAVENOUS | Status: AC
  Filled 2012-08-02: qty 100

## 2012-08-02 MED ORDER — ZOLPIDEM TARTRATE 5 MG PO TABS: 5.0000 mg | ORAL_TABLET | Freq: Every evening | ORAL | Status: DC | PRN

## 2012-08-02 MED ORDER — VECURONIUM BROMIDE 10 MG IV SOLR
INTRAVENOUS | Status: DC | PRN
  Administered 2012-08-02: 2 mg via INTRAVENOUS
  Administered 2012-08-02: 4 mg via INTRAVENOUS
  Administered 2012-08-02: 2 mg via INTRAVENOUS

## 2012-08-02 MED ORDER — METHOCARBAMOL 500 MG PO TABS
500.0000 mg | ORAL_TABLET | Freq: Four times a day (QID) | ORAL | Status: DC | PRN
  Administered 2012-08-03 – 2012-08-04 (×2): 500 mg via ORAL
  Filled 2012-08-02 (×3): qty 1

## 2012-08-02 MED ORDER — BUPIVACAINE-EPINEPHRINE 0.25% -1:200000 IJ SOLN
INTRAMUSCULAR | Status: DC | PRN
  Administered 2012-08-02: 5 mL

## 2012-08-02 MED ORDER — ACETAMINOPHEN 10 MG/ML IV SOLN
1000.0000 mg | Freq: Four times a day (QID) | INTRAVENOUS | Status: AC
  Administered 2012-08-02 – 2012-08-03 (×3): 1000 mg via INTRAVENOUS
  Filled 2012-08-02 (×5): qty 100

## 2012-08-02 MED ORDER — THROMBIN 20000 UNITS EX SOLR
CUTANEOUS | Status: AC
  Filled 2012-08-02: qty 20000

## 2012-08-02 MED ORDER — DEXAMETHASONE 4 MG PO TABS
4.0000 mg | ORAL_TABLET | Freq: Four times a day (QID) | ORAL | Status: DC
  Administered 2012-08-03 (×2): 4 mg via ORAL
  Filled 2012-08-02 (×12): qty 1

## 2012-08-02 MED ORDER — ROCURONIUM BROMIDE 100 MG/10ML IV SOLN
INTRAVENOUS | Status: DC | PRN
  Administered 2012-08-02: 50 mg via INTRAVENOUS

## 2012-08-02 MED ORDER — ONDANSETRON HCL 4 MG/2ML IJ SOLN
INTRAMUSCULAR | Status: DC | PRN
  Administered 2012-08-02: 4 mg via INTRAVENOUS

## 2012-08-02 MED ORDER — MIDAZOLAM HCL 5 MG/5ML IJ SOLN
INTRAMUSCULAR | Status: DC | PRN
  Administered 2012-08-02: 2 mg via INTRAVENOUS
  Administered 2012-08-02 (×2): 1 mg via INTRAVENOUS

## 2012-08-02 MED ORDER — DEXTROSE 5 % IV SOLN: 500.0000 mg | Freq: Four times a day (QID) | INTRAVENOUS | Status: DC | PRN

## 2012-08-02 MED ORDER — DEXAMETHASONE SODIUM PHOSPHATE 4 MG/ML IJ SOLN
INTRAMUSCULAR | Status: DC | PRN
  Administered 2012-08-02: 10 mg via INTRAVENOUS

## 2012-08-02 MED ORDER — LIDOCAINE HCL (CARDIAC) 20 MG/ML IV SOLN
INTRAVENOUS | Status: DC | PRN
  Administered 2012-08-02: 60 mg via INTRAVENOUS

## 2012-08-02 MED ORDER — SODIUM CHLORIDE 0.9 % IJ SOLN: 3.0000 mL | INTRAMUSCULAR | Status: DC | PRN

## 2012-08-02 SURGICAL SUPPLY — 64 items
2.0 MILLING BIT ×1 IMPLANT
ADH SKN CLS APL DERMABOND .7 (GAUZE/BANDAGES/DRESSINGS)
BLADE SURG ROTATE 9660 (MISCELLANEOUS) IMPLANT
BUR EGG ELITE 4.0 (BURR) ×1 IMPLANT
BUR MATCHSTICK NEURO 3.0 LAGG (BURR) IMPLANT
CANISTER SUCTION 2500CC (MISCELLANEOUS) ×2 IMPLANT
CLOTH BEACON ORANGE TIMEOUT ST (SAFETY) ×2 IMPLANT
CLSR STERI-STRIP ANTIMIC 1/2X4 (GAUZE/BANDAGES/DRESSINGS) ×1 IMPLANT
CORDS BIPOLAR (ELECTRODE) ×2 IMPLANT
COVER SURGICAL LIGHT HANDLE (MISCELLANEOUS) ×3 IMPLANT
CRADLE DONUT ADULT HEAD (MISCELLANEOUS) ×2 IMPLANT
DERMABOND ADVANCED (GAUZE/BANDAGES/DRESSINGS)
DERMABOND ADVANCED .7 DNX12 (GAUZE/BANDAGES/DRESSINGS) IMPLANT
DISC INTVRTB LRG 6X (Neuro Prosthesis/Implant) ×1 IMPLANT
DISC PRODISC-C LRG 6MM (Neuro Prosthesis/Implant) ×2 IMPLANT
DRAPE C-ARM 42X72 X-RAY (DRAPES) ×2 IMPLANT
DRAPE POUCH INSTRU U-SHP 10X18 (DRAPES) ×2 IMPLANT
DRAPE SURG 17X23 STRL (DRAPES) ×1 IMPLANT
DRAPE U-SHAPE 47X51 STRL (DRAPES) ×2 IMPLANT
DRSG MEPILEX BORDER 4X4 (GAUZE/BANDAGES/DRESSINGS) IMPLANT
DURAPREP 26ML APPLICATOR (WOUND CARE) ×2 IMPLANT
ELECT COATED BLADE 2.86 ST (ELECTRODE) ×2 IMPLANT
ELECT REM PT RETURN 9FT ADLT (ELECTROSURGICAL) ×2
ELECTRODE REM PT RTRN 9FT ADLT (ELECTROSURGICAL) ×1 IMPLANT
GLOVE BIO SURGEON STRL SZ7.5 (GLOVE) ×1 IMPLANT
GLOVE BIOGEL PI IND STRL 6.5 (GLOVE) IMPLANT
GLOVE BIOGEL PI IND STRL 8 (GLOVE) IMPLANT
GLOVE BIOGEL PI IND STRL 8.5 (GLOVE) ×1 IMPLANT
GLOVE BIOGEL PI INDICATOR 6.5 (GLOVE)
GLOVE BIOGEL PI INDICATOR 8 (GLOVE) ×1
GLOVE BIOGEL PI INDICATOR 8.5 (GLOVE) ×1
GLOVE ECLIPSE 6.0 STRL STRAW (GLOVE) IMPLANT
GLOVE ECLIPSE 7.5 STRL STRAW (GLOVE) ×1 IMPLANT
GLOVE ECLIPSE 8.5 STRL (GLOVE) ×5 IMPLANT
GLOVE SURG SS PI 7.5 STRL IVOR (GLOVE) ×1 IMPLANT
GOWN PREVENTION PLUS XXLARGE (GOWN DISPOSABLE) ×4 IMPLANT
GOWN STRL NON-REIN LRG LVL3 (GOWN DISPOSABLE) ×2 IMPLANT
GOWN STRL REIN XL XLG (GOWN DISPOSABLE) ×1 IMPLANT
INSERTER TIP ×1 IMPLANT
KIT BASIN OR (CUSTOM PROCEDURE TRAY) ×2 IMPLANT
KIT ROOM TURNOVER OR (KITS) ×2 IMPLANT
NDL SPNL 18GX3.5 QUINCKE PK (NEEDLE) ×1 IMPLANT
NEEDLE 22X1 1/2 (OR ONLY) (NEEDLE) ×1 IMPLANT
NEEDLE SPNL 18GX3.5 QUINCKE PK (NEEDLE) ×2 IMPLANT
NS IRRIG 1000ML POUR BTL (IV SOLUTION) ×2 IMPLANT
PACK ORTHO CERVICAL (CUSTOM PROCEDURE TRAY) ×2 IMPLANT
PACK UNIVERSAL I (CUSTOM PROCEDURE TRAY) ×2 IMPLANT
PAD ARMBOARD 7.5X6 YLW CONV (MISCELLANEOUS) ×4 IMPLANT
PRODISC-C RETRAINER SCREW 3.5X14MM ×2 IMPLANT
SPONGE INTESTINAL PEANUT (DISPOSABLE) ×1 IMPLANT
SPONGE SURGIFOAM ABS GEL 100 (HEMOSTASIS) ×2 IMPLANT
STRIP CLOSURE SKIN 1/2X4 (GAUZE/BANDAGES/DRESSINGS) ×1 IMPLANT
SURGIFLO TRUKIT (HEMOSTASIS) IMPLANT
SUT MNCRL AB 3-0 PS2 18 (SUTURE) ×2 IMPLANT
SUT SILK 2 0 (SUTURE) ×2
SUT SILK 2-0 18XBRD TIE 12 (SUTURE) ×1 IMPLANT
SUT VIC AB 2-0 CT1 18 (SUTURE) ×2 IMPLANT
SYR BULB IRRIGATION 50ML (SYRINGE) ×2 IMPLANT
SYR CONTROL 10ML LL (SYRINGE) ×2 IMPLANT
TAPE CLOTH 4X10 WHT NS (GAUZE/BANDAGES/DRESSINGS) ×2 IMPLANT
TAPE UMBILICAL COTTON 1/8X30 (MISCELLANEOUS) ×2 IMPLANT
TOWEL OR 17X24 6PK STRL BLUE (TOWEL DISPOSABLE) ×2 IMPLANT
TOWEL OR 17X26 10 PK STRL BLUE (TOWEL DISPOSABLE) ×2 IMPLANT
WATER STERILE IRR 1000ML POUR (IV SOLUTION) ×1 IMPLANT

## 2012-08-02 NOTE — Progress Notes (Signed)
Utilization review completed.  

## 2012-08-02 NOTE — Anesthesia Postprocedure Evaluation (Signed)
  Anesthesia Post-op Note  Patient: William Novak  Procedure(s) Performed: Procedure(s) (LRB) with comments: ANTERIOR CERVICAL DECOMPRESSION/DISCECTOMY FUSION 1 LEVEL (N/A) - Cervical Total Disc Replacement Verses ACDF C5-C6   Patient Location: PACU  Anesthesia Type: General  Level of Consciousness: awake, alert  and oriented  Airway and Oxygen Therapy: Patient Spontanous Breathing and Patient connected to nasal cannula oxygen  Post-op Pain: mild  Post-op Assessment: Post-op Vital signs reviewed  Post-op Vital Signs: Reviewed  Complications: No apparent anesthesia complications

## 2012-08-02 NOTE — Transfer of Care (Signed)
Immediate Anesthesia Transfer of Care Note  Patient: William Novak  Procedure(s) Performed: Procedure(s) (LRB) with comments: ANTERIOR CERVICAL DECOMPRESSION/DISCECTOMY FUSION 1 LEVEL (N/A) - Cervical Total Disc Replacement Verses ACDF C5-C6   Patient Location: PACU  Anesthesia Type: General  Level of Consciousness: sedated, patient cooperative and responds to stimulation  Airway & Oxygen Therapy: Patient Spontanous Breathing and Patient connected to face mask oxygen  Post-op Assessment: Report given to PACU RN and Post -op Vital signs reviewed and stable  Post vital signs: Reviewed and stable  Complications: No apparent anesthesia complications

## 2012-08-02 NOTE — Preoperative (Signed)
Beta Blockers   Reason not to administer Beta Blockers:Not Applicable 

## 2012-08-02 NOTE — H&P (Signed)
H+P REVIEWED NO CHANGE IN CLINICAL EXAM 

## 2012-08-02 NOTE — Anesthesia Procedure Notes (Signed)
Procedure Name: Intubation Date/Time: 08/02/2012 8:42 AM Performed by: Wray Kearns A Pre-anesthesia Checklist: Patient identified, Timeout performed, Emergency Drugs available, Suction available and Patient being monitored Patient Re-evaluated:Patient Re-evaluated prior to inductionOxygen Delivery Method: Circle system utilized Preoxygenation: Pre-oxygenation with 100% oxygen Intubation Type: IV induction and Cricoid Pressure applied Ventilation: Mask ventilation without difficulty Laryngoscope Size: Mac and 4 Grade View: Grade I Tube type: Oral Tube size: 8.0 mm Number of attempts: 1 Airway Equipment and Method: Stylet Placement Confirmation: ETT inserted through vocal cords under direct vision,  breath sounds checked- equal and bilateral and positive ETCO2 Secured at: 23 cm Tube secured with: Tape Dental Injury: Teeth and Oropharynx as per pre-operative assessment

## 2012-08-02 NOTE — Anesthesia Preprocedure Evaluation (Signed)
Anesthesia Evaluation  Patient identified by MRN, date of birth, ID band Patient awake    Reviewed: Allergy & Precautions, H&P , NPO status , Patient's Chart, lab work & pertinent test results  Airway Mallampati: I TM Distance: >3 FB Neck ROM: Full    Dental  (+) Teeth Intact and Dental Advisory Given   Pulmonary  breath sounds clear to auscultation        Cardiovascular hypertension, Pt. on medications Rhythm:Regular Rate:Normal     Neuro/Psych    GI/Hepatic   Endo/Other    Renal/GU      Musculoskeletal   Abdominal   Peds  Hematology   Anesthesia Other Findings   Reproductive/Obstetrics                           Anesthesia Physical Anesthesia Plan  ASA: I  Anesthesia Plan: General   Post-op Pain Management:    Induction: Intravenous  Airway Management Planned: Oral ETT  Additional Equipment:   Intra-op Plan:   Post-operative Plan: Extubation in OR  Informed Consent: I have reviewed the patients History and Physical, chart, labs and discussed the procedure including the risks, benefits and alternatives for the proposed anesthesia with the patient or authorized representative who has indicated his/her understanding and acceptance.   Dental advisory given  Plan Discussed with: CRNA, Anesthesiologist and Surgeon  Anesthesia Plan Comments:         Anesthesia Quick Evaluation

## 2012-08-02 NOTE — Brief Op Note (Signed)
08/02/2012  12:00 PM  PATIENT:  William Novak  46 y.o. male  PRE-OPERATIVE DIAGNOSIS:  cervical radiculopathy   POST-OPERATIVE DIAGNOSIS:  cervical radiculopathy   PROCEDURE:  Procedure(s) (LRB) with comments: ANTERIOR CERVICAL DECOMPRESSION/DISCECTOMY FUSION 1 LEVEL (N/A) - Cervical Total Disc Replacement Verses ACDF C5-C6   SURGEON:  Surgeon(s) and Role:    * Venita Lick, MD - Primary  PHYSICIAN ASSISTANT:   ASSISTANTS: Norval Gable   ANESTHESIA:   none  EBL:  Total I/O In: 2050 [I.V.:2050] Out: 150 [Blood:150]  BLOOD ADMINISTERED:none  DRAINS: none   LOCAL MEDICATIONS USED:  MARCAINE     SPECIMEN:  No Specimen  DISPOSITION OF SPECIMEN:  N/A  COUNTS:  YES  TOURNIQUET:  * No tourniquets in log *  DICTATION: .Other Dictation: Dictation Number [id number]  PLAN OF CARE: Admit to inpatient   PATIENT DISPOSITION:  PACU - hemodynamically stable.

## 2012-08-02 NOTE — Progress Notes (Signed)
08/02/2012 2:16 PM Pt arrived from pacu,vss,oriented to unit. Call bell within reach. Will continue to monitor. William Novak

## 2012-08-03 ENCOUNTER — Encounter (HOSPITAL_COMMUNITY): Payer: Self-pay | Admitting: Orthopedic Surgery

## 2012-08-03 NOTE — Progress Notes (Signed)
Clinical Social Work  CSW received inappropriate referral for SNF placement. Per progression meeting with RN, patient does not need placement. CSW is signing off but available if needed.   [place], [place] [phone]

## 2012-08-03 NOTE — Progress Notes (Signed)
    Subjective: Procedure(s) (LRB): ANTERIOR CERVICAL DECOMPRESSION/DISCECTOMY FUSION 1 LEVEL (N/A) 1 Day Post-Op  Patient reports pain as 3 on 0-10 scale.  Reports decreased arm pain denies incisional neck pain   Positive void Negative bowel movement Positive flatus Negative chest pain or shortness of breath  Objective: Vital signs in last 24 hours: Temp:  [98 F (36.7 C)-98.7 F (37.1 C)] 98.4 F (36.9 C) (09/19 0417) Pulse Rate:  [74-118] 98  (09/19 0417) Resp:  [17-26] 21  (09/18 2220) BP: (115-153)/(60-84) 136/80 mmHg (09/19 0417) SpO2:  [95 %-100 %] 99 % (09/19 0417) Weight:  [101.6 kg (223 lb 15.8 oz)] 101.6 kg (223 lb 15.8 oz) (09/18 1413)  Intake/Output from previous day: 09/18 0701 - 09/19 0700 In: 3840 [I.V.:3840] Out: 4145 [Urine:3995; Blood:150]  Labs: No results found for this basename: WBC:2,RBC:2,HCT:2,PLT:2 in the last 72 hours No results found for this basename: NA:2,K:2,CL:2,CO2:2,BUN:2,CREATININE:2,GLUCOSE:2,CALCIUM:2 in the last 72 hours No results found for this basename: LABPT:2,INR:2 in the last 72 hours  Physical Exam: Neurologically intact ABD soft Neurovascular intact Incision: dressing C/D/I and no drainage No cellulitis present Compartment soft no stridor or sqwelling at incision site  Assessment/Plan: Patient stable  xrays satisfactory Mobilization with physical therapy Encourage incentive spirometry Continue care  Advance diet Up with therapy Plan for discharge tomorrow [facility] for transfer to floor.  Patient with history of pulmonary issue post-op.  Because of cncern for angioedema and airway compromise will keep another 24 hrs for observation.  Patient is alert and stable - ok for transfer to regular floor.  Venita Lick, MD [facility] [phone]

## 2012-08-03 NOTE — Progress Notes (Signed)
Patient being transferred to [facility] via wheelchair. Phone report has been called Kristi,rn. Patient and wife are aware of the transfer.

## 2012-08-03 NOTE — Evaluation (Signed)
Physical Therapy Evaluation Patient Details Name: MAHMUD KEITHLY MRN: [id number] DOB: 10-07-1966 Today's Date: 08/03/2012 Time: 9629-5284 PT Time Calculation (min): 19 min  PT Assessment / Plan / Recommendation Clinical Impression  Pt s/p ACDF C5-6. Pt is at his baseline functional level. All education complete, no acute PT needs. Will not follow    PT Assessment  Patent does not need any further PT services    Follow Up Recommendations  No PT follow up       Equipment Recommendations  None recommended by PT          Precautions / Restrictions Precautions Precautions: Cervical Required Braces or Orthoses: Cervical Brace Cervical Brace: Soft collar;Applied in supine position (pt has both collars; order for soft collar) Restrictions Weight Bearing Restrictions: No   Pertinent Vitals/Pain Pt with no pain complaints.       Mobility  Bed Mobility Bed Mobility: Rolling Right;Right Sidelying to Sit;Sitting - Scoot to Edge of Bed Rolling Right: 7: Independent Right Sidelying to Sit: 7: Independent Sitting - Scoot to Edge of Bed: 7: Independent Transfers Transfers: Sit to Stand;Stand to Sit Sit to Stand: 7: Independent Stand to Sit: 7: Independent Ambulation/Gait Ambulation/Gait Assistance: 5: Supervision Ambulation Distance (Feet): 250 Feet Assistive device: None Ambulation/Gait Assistance Details: Cueing throughout to maintain cervical precautions while turning to talk to someone or look for an object Gait Pattern: Within Functional Limits Gait velocity: normal gait speed     Visit Information  Last PT Received On: 08/03/12 Assistance Needed: +1 PT/OT Co-Evaluation/Treatment: Yes    Subjective Data      Prior Functioning  Home Living Lives With: Spouse Available Help at Discharge: Family;Available 24 hours/day Type of Home: Apartment Home Access: Level entry Home Layout: One level Bathroom Shower/Tub: Tub/shower unit;Curtain Firefighter: Handicapped  height Bathroom Accessibility: Yes How Accessible: Accessible via walker Home Adaptive Equipment: None Prior Function Level of Independence: Independent Able to Take Stairs?: Yes Driving: Yes 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 Communication Communication: No difficulties Dominant Hand: Right    Cognition  Overall Cognitive Status: Appears within functional limits for tasks assessed/performed Arousal/Alertness: Awake/alert Orientation Level: Appears intact for tasks assessed Behavior During Session: [facility] for tasks performed    Extremity/Trunk Assessment Right Lower Extremity Assessment RLE ROM/Strength/Tone: Within functional levels RLE Sensation: WFL - Light Touch Left Lower Extremity Assessment LLE ROM/Strength/Tone: Within functional levels LLE Sensation: WFL - Light Touch   Balance Balance Balance Assessed: No  End of Session PT - End of Session Equipment Utilized During Treatment: Gait belt;Cervical collar Activity Tolerance: Patient tolerated treatment well Patient left: in chair;with call bell/phone within reach;with family/visitor present Nurse Communication: Mobility status    Milana Kidney 08/03/2012, 1:07 PM

## 2012-08-03 NOTE — Evaluation (Signed)
Occupational Therapy Evaluation Patient Details Name: William Novak MRN: [id number] DOB: 08/01/1966 Today's Date: 08/03/2012 Time: 7829-5621 OT Time Calculation (min): 21 min  OT Assessment / Plan / Recommendation Clinical Impression  Pt s/p ACDF. All education completed and pt to have all necessary assist at d/c. No further acute OT needs at this time. Will sign off.    OT Assessment  Patient does not need any further OT services    Follow Up Recommendations  No OT follow up    Barriers to Discharge      Equipment Recommendations  None recommended by OT;None recommended by PT    Recommendations for Other Services    Frequency       Precautions / Restrictions Precautions Precautions: Cervical Required Braces or Orthoses: Cervical Brace Cervical Brace: Soft collar;Applied in supine position (pt has both hard and soft collar) Restrictions Weight Bearing Restrictions: No   Pertinent Vitals/Pain Pt denies any pain at this time    ADL  Eating/Feeding: Simulated;Modified independent Where Assessed - Eating/Feeding: Chair Grooming: Performed;Modified independent Where Assessed - Grooming: Unsupported sitting Upper Body Bathing: Simulated;Independent Where Assessed - Upper Body Bathing: Unsupported standing Lower Body Bathing: Simulated;Supervision/safety Where Assessed - Lower Body Bathing: Unsupported standing Upper Body Dressing: Simulated;Modified independent Where Assessed - Upper Body Dressing: Unsupported sitting Lower Body Dressing: Performed;Modified independent (cross ankles over knees to avoid bending down to feet) Where Assessed - Lower Body Dressing: Unsupported sit to stand Toilet Transfer: Performed;Independent Toilet Transfer Method: Sit to Barista: Regular height toilet Toileting - Clothing Manipulation and Hygiene: Simulated;Independent Where Assessed - Toileting Clothing Manipulation and Hygiene: Sit to stand from 3-in-1 or  toilet Tub/Shower Transfer: Simulated;Modified independent (steady self on wall) Tub/Shower Transfer Method: Ambulating Equipment Used: Gait belt Transfers/Ambulation Related to ADLs: I with all ambulation ADL Comments: Pt and wife educated on collar wear (hard when OOB, soft in bed unless otherwise told by MD), collar adjustments, pad changing and cleaning, adaptations for eating and face care. Lifting, pushing/pulling and bending restrictions as well as cervical precautions. No further needs indicated    OT Diagnosis:    OT Problem List:   OT Treatment Interventions:     OT Goals    Visit Information  Last OT Received On: 08/03/12 Assistance Needed: +1    Subjective Data  Subjective: I'm doing better than I thought. 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 Communication: No difficulties Dominant Hand: Right      Cognition  Overall Cognitive Status: Appears within functional limits for tasks assessed/performed Arousal/Alertness: Awake/alert Orientation Level: Appears intact for tasks assessed Behavior During Session: [facility] for tasks performed    Extremity/Trunk Assessment Right Upper Extremity Assessment RUE ROM/Strength/Tone: Within functional levels RUE Sensation: WFL - Light Touch RUE Coordination: WFL - gross/fine motor Left Upper Extremity Assessment LUE ROM/Strength/Tone: Within functional levels LUE Sensation: WFL -  Light Touch LUE Coordination: WFL - gross/fine motor   Mobility  Shoulder Instructions  Bed Mobility Bed Mobility: Rolling Right;Right Sidelying to Sit;Sitting - Scoot to Edge of Bed Rolling Right: 7: Independent Right Sidelying  to Sit: 7: Independent Sitting - Scoot to Edge of Bed: 7: Independent Transfers Sit to Stand: 7: Independent Stand to Sit: 7: Independent       Exercise     Balance     End of Session  Pt left in chair with wife at end of session with call bell in reach  GO     Jackey Housey 08/03/2012, 3:28 PM

## 2012-08-03 NOTE — Op Note (Signed)
NAMEMarland Novak  KHAMANI, FAIRLEY NO.:  [id number]  MEDICAL RECORD NO.:  [id number]  LOCATION:  3314                         FACILITY:  [facility]  PHYSICIAN:  Alvy Beal, MD    DATE OF BIRTH:  [birth year]  DATE OF PROCEDURE:  08/02/2012 DATE OF DISCHARGE:                              OPERATIVE REPORT   PREOPERATIVE DIAGNOSIS:  Cervical spondylotic radiculopathy.  POSTOPERATIVE DIAGNOSIS:  Cervical spondylotic radiculopathy.  OPERATIVE PROCEDURE:  Total disk arthroplasty, C5-6.  INSTRUMENTATION USED:  ProDisc-C.  COMPLICATIONS:  None.  SIZE OF THE IMPLANT:  Size 6, large.  HISTORY:  This is a very pleasant gentleman who has been having significant neck and radicular arm pain.  Despite appropriate conservative management, his overall quality of life has continued to suffer.  As a result of the failure of nonoperative management, we elected to proceed with surgery.  All appropriate risks, benefits, and alternatives were discussed with the patient and consent was obtained.  OPERATIVE NOTE:  The patient was brought to the operating room, placed supine on the operating table.  After successful induction of general anesthesia and endotracheal intubation, TEDs, SCDs were applied.  The patient was secured to the table.  Arms were secured over at each sides. The shoulders were taped down and inflatable cuff was placed underneath the shoulders.  It was expanded to allow his neck proper positioning for anterior cervical approach.  The neck was then prepped and draped in a standard fashion.  Appropriate time-out was then taken to confirm the patient, procedure, and all other pertinent important data.  X-ray was then brought into the field.  I identified the C5-6 space and then infiltrated a transverse incision site starting at the midline and proceeding to the left.  About an inch and half incision was made and I sharply dissected down to the deep fascia as well as the  platysma. Platysma was sharply incised and then I began mobilizing the deep cervical and prevertebral fascia with finger.  I then swept the omohyoid medially and placed a retractor to retract the esophagus and the trachea.  I then palpated the carotid sheath and protected it with a finger laterally.  I then used Kittner dissectors to remove the remainder of the prevertebral fascia to expose the anterior longitudinal ligament.  Once this was done, I placed a needle into the C5-6 disk space, took an x-ray and confirmed that I was at the appropriate level. Once this was done, I then mobilized the longus coli muscles with bipolar electrocautery from the midbody of C5 to the midbody of C6. Self-retaining retractors were placed underneath the longus coli muscle. The endotracheal cuff was deflated.  I expanded the retractors to the appropriate width and then reinflated the cuff.  I then used a 15-blade scalpel to perform an annulotomy at C5-6 and then used the combination of pituitary rongeurs, curettes, and Kerrison rongeurs to remove the bulk of the disk material.  At this point in the AP plane, I identified the midportion of the cervical body based on the spinous process.  I then placed an awl at this spot and a distraction pin into this level. I then repeated that at C6.  In the lateral plane, I confirmed that the distraction pins were parallel to the respective endplates.  Once I had this properly positioned, I then distracted the interbody space and secured the distraction to maintain it.  At this point, I then continued with micro-nerve curettes to remove the posterior anulus and released it from the posterior aspect of the vertebral bodies.  I then used a series of micro-nerve hooks to gently make a plane underneath the posterior longitudinal ligament.  I then used a 1 mm Kerrison to resect the entire posterior longitudinal ligament.  At this point, I could now sweep the nerve hook  underneath the vertebral bodies of C5 and C6.  I had adequately removed the posterior longitudinal ligament and completely decompressed the area.  At this point, I confirmed that I had removed all of the cartilaginous endplate from the subchondral bone to expose the subchondral bone.  I then trialed with sequential trial implants and elected to use the 6 large implant.  I confirmed proper positioning in both planes.  Once it was properly seated to the correct depth, I then secured the targeting device over it and then used the reaming rotary drill bur to ream out the keel cuts.  Once both keel cuts were made, I removed the trial implant and then cleaned out all of the keel cuts.  At this point, I then obtained the 6 large implant and malleted to the appropriate depth.  I confirmed satisfactory position in both planes.  I removed the inserting devices, irrigated copiously with normal saline, and then removed the distraction pins.  I then made sure I had hemostasis using bone wax to seal the distraction pinholes and bipolar electrocautery to provide hemostasis for the soft tissue.  At this point, once I had complete hemostasis, I returned the trachea and esophagus to midline, and then closed the platysma with interrupted 2-0 Vicryl sutures.  A 3-0 Monocryl was used for the skin.  Steri-Strips and dry dressing were applied.  According to Anesthesia, the patient had issues in the past with angioedema and questionable flash pulmonary edema.  Given the fact that we retracting the cervical spine for approximately 2.5-3 hours with this surgical procedure, I did recommend a Step-Down Unit admission rather than regular floor admission.  This would allow more close observation and should airway compromise developed, he will be in a more secure location to have it determined.     Alvy Beal, MD     DDB/MEDQ  D:  08/02/2012  T:  08/03/2012  Job:  [id number]

## 2012-08-04 MED ORDER — ONDANSETRON HCL 4 MG PO TABS: 4.0000 mg | ORAL_TABLET | Freq: Three times a day (TID) | ORAL | Status: DC | PRN

## 2012-08-04 MED ORDER — MENTHOL 3 MG MT LOZG: 1.0000 | LOZENGE | OROMUCOSAL | Status: DC | PRN

## 2012-08-04 MED ORDER — CYCLOBENZAPRINE HCL 5 MG PO TABS: 10.0000 mg | ORAL_TABLET | Freq: Three times a day (TID) | ORAL | Status: DC | PRN

## 2012-08-04 MED ORDER — OXYCODONE-ACETAMINOPHEN 10-325 MG PO TABS: 1.0000 | ORAL_TABLET | Freq: Four times a day (QID) | ORAL | Status: DC | PRN

## 2012-08-04 NOTE — Progress Notes (Signed)
    Subjective: Procedure(s) (LRB): ANTERIOR CERVICAL DECOMPRESSION/DISCECTOMY FUSION 1 LEVEL (N/A) 2 Days Post-Op  Patient reports pain as 2 on 0-10 scale.  Reports decreased arm pain denies incisional neck pain   POSITIVE void Negative bowel movement Positive flatus Negative chest pain or shortness of breath  Objective: Vital signs in last 24 hours: Temp:  [97.5 F (36.4 C)-98.5 F (36.9 C)] 97.5 F (36.4 C) (09/20 0700) Pulse Rate:  [86-106] 86  (09/20 0700) Resp:  [18] 18  (09/20 0700) BP: (141-152)/(72-89) 146/79 mmHg (09/20 0700) SpO2:  [95 %-100 %] 95 % (09/20 0700)  Intake/Output from previous day: 09/19 0701 - 09/20 0700 In: 940 [P.O.:600; I.V.:340] Out: 1000 [Urine:1000]  Labs: No results found for this basename: WBC:2,RBC:2,HCT:2,PLT:2 in the last 72 hours No results found for this basename: NA:2,K:2,CL:2,CO2:2,BUN:2,CREATININE:2,GLUCOSE:2,CALCIUM:2 in the last 72 hours No results found for this basename: LABPT:2,INR:2 in the last 72 hours  Physical Exam: Neurologically intact ABD soft Neurovascular intact Incision: dressing C/D/I and no drainage Compartment soft no swelling/hematoma noted  Assessment/Plan: Patient stable  Xray satisfactory hardware position Mobilization with physical therapy Encourage incentive spirometry Continue care  Advance diet Up with therapy D/c to home today  Venita Lick, MD [facility] [phone] \

## 2012-08-11 NOTE — Discharge Summary (Signed)
Patient ID: William Novak MRN: [id number] DOB/AGE: 05-24-1966 46 y.o.  Admit date: 08/02/2012 Discharge date: 08/04/2012  Admission Diagnoses:  Active Problems:  Cervical radiculopathy   Discharge Diagnoses:  Active Problems:  Cervical radiculopathy  status post Procedure(s): ANTERIOR CERVICAL DECOMPRESSION/DISCECTOMY FUSION 1 LEVEL  Past Medical History  Diagnosis Date  . Hypoglycemia   . Hypertension   . Arthritis     Generalized  . Low back pain   . Concussion   . Angioedema     Surgeries: Procedure(s): ANTERIOR CERVICAL DECOMPRESSION/DISCECTOMY FUSION 1 LEVEL on 08/02/2012   Consultants: none  Discharged Condition: Improved  Hospital Course: William Novak is an 46 y.o. male who was admitted 08/02/2012 for operative treatment of cervical radiculopathy. Patient failed conservative treatments (please see the history and physical for the specifics) and had severe unremitting pain that affects sleep, daily activities and work/hobbies. After pre-op clearance, the patient was taken to the operating room on 08/02/2012 and underwent  Procedure(s): ANTERIOR CERVICAL DECOMPRESSION/DISCECTOMY FUSION 1 LEVEL.    Patient was given perioperative antibiotics:  Anti-infectives     Start     Dose/Rate Route Frequency Ordered Stop   08/02/12 1430   ceFAZolin (ANCEF) IVPB 1 g/50 mL premix        1 g 100 mL/hr over 30 Minutes Intravenous Every 8 hours 08/02/12 1409 08/02/12 2337   08/01/12 1500   ceFAZolin (ANCEF) IVPB 2 g/50 mL premix        2 g 100 mL/hr over 30 Minutes Intravenous 60 min pre-op 08/01/12 1500 08/02/12 0840           Patient was given sequential compression devices and early ambulation to prevent DVT.   Patient benefited maximally from hospital stay and there were no complications. At the time of discharge, the patient was urinating/moving their bowels without difficulty, tolerating a regular diet, pain is controlled with oral pain medications and  they have been cleared by PT/OT.   Recent vital signs: No data found.    Recent laboratory studies: No results found for this basename: WBC:2,HGB:2,HCT:2,PLT:2,NA:2,K:2,CL:2,CO2:2,BUN:2,CREATININE:2,GLUCOSE:2,PT:2,INR:2,CALCIUM,2: in the last 72 hours   Discharge Medications:     Medication List     As of 08/11/2012 11:14 AM    STOP taking these medications         HYDROcodone-acetaminophen 7.5-325 MG per tablet   Commonly known as: NORCO      TAKE these medications         cyclobenzaprine 5 MG tablet   Commonly known as: FLEXERIL   Take 2 tablets (10 mg total) by mouth 3 (three) times daily as needed for muscle spasms. For spasms      diphenhydrAMINE 25 mg capsule   Commonly known as: BENADRYL   Take 25 mg by mouth every 6 (six) hours as needed. For allergies      menthol-cetylpyridinium 3 MG lozenge   Commonly known as: CEPACOL   Take 1 lozenge (3 mg total) by mouth as needed for pain (sore throat).      ondansetron 4 MG tablet   Commonly known as: ZOFRAN   Take 1 tablet (4 mg total) by mouth every 8 (eight) hours as needed for nausea.      oxyCODONE-acetaminophen 10-325 MG per tablet   Commonly known as: PERCOCET   Take 1 tablet by mouth every 6 (six) hours as needed for pain.        Diagnostic Studies:  Chest 2 View  07/24/2012  *RADIOLOGY REPORT*  Clinical Data: Preop respiratory exam for anterior cervical discectomy and fusion.  CHEST - 2 VIEW  Comparison:  None.  Findings:  The heart size and mediastinal contours are within normal limits.  Both lungs are clear.  Old fracture deformity of the distal right clavicle incidentally noted.  IMPRESSION: No active cardiopulmonary disease.   Original Report Authenticated By: Danae Orleans, M.D.    Dg Cervical Spine 2-3 Views  08/02/2012  *RADIOLOGY REPORT*  Clinical Data: 46 year old male undergoing cervical spine surgery.  DG C-ARM GT 120 MIN,CERVICAL SPINE - 2-3 VIEW  Technique: Intraoperative fluoroscopic frontal and  lateral views of the cervical spine.  Fluoroscopy time of 1.5 minutes was utilized.  Comparison:   Preoperative radiographs 07/24/2012.  Findings: These images demonstrate disc arthroplasty at C5-C6.  IMPRESSION: C5-C6 disc arthroplasty in place.   Original Report Authenticated By: Harley Hallmark, M.D.    Dg Cervical Spine 2-3 Views  08/02/2012  *RADIOLOGY REPORT*  Clinical Data: Interbody cervical fusion.  CERVICAL SPINE - 2-3 VIEW  Comparison: Intraoperative films, same date.  Findings: Interbody fusion device at C5-6 in good position without complicating features.  IMPRESSION: Interbody fusion device at C5-6.   Original Report Authenticated By: P. Loralie Champagne, M.D.    Dg Cervical Spine 2-3 Views  07/24/2012  *RADIOLOGY REPORT*  Clinical Data: Cervical spondylosis. Preop pre.  CERVICAL SPINE - 2-3 VIEW  Comparison: None.  Findings: No evidence of acute fracture, subluxation, or prevertebral soft tissue swelling.  Moderate degenerative disc disease is seen at C5-6 and C6-7. Bilateral uncovertebral spurring also seen at these levels. No other significant bone abnormality identified.  IMPRESSION:  Moderate degenerative disc disease at C5-6 and C6-7.   Original Report Authenticated By: Danae Orleans, M.D.    Dg C-arm Gt 120 Min  08/02/2012  *RADIOLOGY REPORT*  Clinical Data: 46 year old male undergoing cervical spine surgery.  DG C-ARM GT 120 MIN,CERVICAL SPINE - 2-3 VIEW  Technique: Intraoperative fluoroscopic frontal and lateral views of the cervical spine.  Fluoroscopy time of 1.5 minutes was utilized.  Comparison:   Preoperative radiographs 07/24/2012.  Findings: These images demonstrate disc arthroplasty at C5-C6.  IMPRESSION: C5-C6 disc arthroplasty in place.   Original Report Authenticated By: Harley Hallmark, M.D.         Discharge Plan:  discharge to home   Disposition: stable at the time of discharge    Signed: Gwinda Maine for Dr. Venita Lick [facility] [phone] 08/11/2012, 11:14 AM

## 2012-09-08 ENCOUNTER — Telehealth: Payer: Self-pay | Admitting: Family Medicine

## 2012-09-08 ENCOUNTER — Telehealth: Payer: Self-pay

## 2012-09-08 ENCOUNTER — Other Ambulatory Visit: Payer: Self-pay

## 2012-09-08 MED ORDER — CYCLOBENZAPRINE HCL 5 MG PO TABS: 10.0000 mg | ORAL_TABLET | Freq: Three times a day (TID) | ORAL | Status: DC | PRN

## 2012-09-08 NOTE — Telephone Encounter (Signed)
Call in Flexeril 10 mg tid prn spasm, #60 with 2 rf

## 2012-09-08 NOTE — Telephone Encounter (Signed)
Last seen 02/20/12  Back pain  Please advise

## 2012-09-08 NOTE — Telephone Encounter (Signed)
error 

## 2012-09-08 NOTE — Telephone Encounter (Signed)
Pt would like refill on flexeril last prescribed by Dr Shon Baton. Patient out of work due to back surgery. 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

## 2013-05-29 ENCOUNTER — Emergency Department (HOSPITAL_COMMUNITY)
Admission: EM | Admit: 2013-05-29 | Discharge: 2013-05-29 | Disposition: A | Discharge status: Home / Self Care | Payer: 59 | Attending: Emergency Medicine | Admitting: Emergency Medicine

## 2013-05-29 ENCOUNTER — Encounter (HOSPITAL_COMMUNITY): Payer: Self-pay | Admitting: *Deleted

## 2013-05-29 ENCOUNTER — Emergency Department (HOSPITAL_COMMUNITY): Payer: 59

## 2013-05-29 DIAGNOSIS — Z981 Arthrodesis status: Secondary | ICD-10-CM | POA: Insufficient documentation

## 2013-05-29 DIAGNOSIS — Y990 Civilian activity done for income or pay: Secondary | ICD-10-CM | POA: Insufficient documentation

## 2013-05-29 DIAGNOSIS — Z8639 Personal history of other endocrine, nutritional and metabolic disease: Secondary | ICD-10-CM | POA: Insufficient documentation

## 2013-05-29 DIAGNOSIS — X500XXA Overexertion from strenuous movement or load, initial encounter: Secondary | ICD-10-CM | POA: Insufficient documentation

## 2013-05-29 DIAGNOSIS — Z8739 Personal history of other diseases of the musculoskeletal system and connective tissue: Secondary | ICD-10-CM | POA: Insufficient documentation

## 2013-05-29 DIAGNOSIS — Y9389 Activity, other specified: Secondary | ICD-10-CM | POA: Insufficient documentation

## 2013-05-29 DIAGNOSIS — S161XXA Strain of muscle, fascia and tendon at neck level, initial encounter: Secondary | ICD-10-CM

## 2013-05-29 DIAGNOSIS — Y9289 Other specified places as the place of occurrence of the external cause: Secondary | ICD-10-CM | POA: Insufficient documentation

## 2013-05-29 DIAGNOSIS — Z862 Personal history of diseases of the blood and blood-forming organs and certain disorders involving the immune mechanism: Secondary | ICD-10-CM | POA: Insufficient documentation

## 2013-05-29 DIAGNOSIS — Z87828 Personal history of other (healed) physical injury and trauma: Secondary | ICD-10-CM | POA: Insufficient documentation

## 2013-05-29 DIAGNOSIS — S139XXA Sprain of joints and ligaments of unspecified parts of neck, initial encounter: Secondary | ICD-10-CM | POA: Insufficient documentation

## 2013-05-29 DIAGNOSIS — I10 Essential (primary) hypertension: Secondary | ICD-10-CM | POA: Insufficient documentation

## 2013-05-29 MED ORDER — CYCLOBENZAPRINE HCL 10 MG PO TABS: 10.0000 mg | ORAL_TABLET | Freq: Three times a day (TID) | ORAL | Status: DC | PRN

## 2013-05-29 NOTE — ED Notes (Signed)
Pt had disc replacement in neck 9/13; tonight at work when looking up felt sharp pain down neck and felt like was going to lock up; pt states when he looks up feels like pain will come back; numbness at base of neck

## 2013-05-29 NOTE — ED Provider Notes (Signed)
Patient care assumed from William Favor, FNP at shift change with instruction to d/c with flexeril if imaging negative for acute changes.  DG cervical spine without evidence of fracture or subluxation. Patient hemodynamically stable and appropriate for d/c.  Dg Cervical Spine Complete  05/29/2013   *RADIOLOGY REPORT*  Clinical Data: New posterior neck pain.  CERVICAL SPINE - COMPLETE 4+ VIEW  Comparison: Cervical spine radiographs performed 08/02/2012  Findings: There is no evidence of fracture or subluxation. Vertebral bodies demonstrate normal height and alignment.  The patient is status post disc replacement surgery at C5-C6; this is grossly unchanged in appearance.  Prevertebral soft tissues are within normal limits.  The provided odontoid view demonstrates no significant abnormality.  The visualized lung apices are clear.  IMPRESSION:  1.  No evidence of fracture or subluxation along the cervical spine. 2.  Postoperative change at C5-C6, stable from the prior study.   Original Report Authenticated By: Tonia Ghent, M.D.      Antony Madura, PA-C 05/29/13 [phone]

## 2013-05-29 NOTE — ED Provider Notes (Signed)
History    CSN: [id number] Arrival date & time 05/29/13  0500  First MD Initiated Contact with Patient 05/29/13 [phone]     Chief Complaint  Patient presents with  . Neck Pain   (Consider location/radiation/quality/duration/timing/severity/associated sxs/prior Treatment) HPI Comments: Patient states, that in September of 2000 at 37.  He had a cervical neck fusion, he was released from Dr. Shon Baton care to resume full activities.  In March 2014.  Tonight, while at work.  He was looking up, and he felt a sudden pain in his neck, and thought it made "locked up" he slowly lower.  His head to normal.  Front facing position, but has had persistent "tightness" on the left side of his neck.  He has not taken any medication.  Prior to arrival.  He denies any numbness or tingling/ pain radiating into his shoulder or arm  Patient is a 47 y.o. male presenting with neck pain. The history is provided by the patient.  Neck Pain Pain location:  L side Quality:  Aching Pain radiates to:  Does not radiate Pain severity:  Mild Onset quality:  Sudden Duration:  1 hour Timing:  Constant Progression:  Unchanged Chronicity:  New Context comment:  Pain while looking up Relieved by:  None tried Worsened by:  Position Ineffective treatments:  None tried Associated symptoms: no fever, no numbness and no weakness    Past Medical History  Diagnosis Date  . Hypoglycemia   . Hypertension   . Arthritis     Generalized  . Low back pain   . Concussion   . Angioedema    Past Surgical History  Procedure Laterality Date  . Fistula removal    . Hemmorhoidectomy    . Anterior cervical decomp/discectomy fusion  08/02/2012    Procedure: ANTERIOR CERVICAL DECOMPRESSION/DISCECTOMY FUSION 1 LEVEL;  Surgeon: Venita Lick, MD;  Location: [facility] OR;  Service: Orthopedics;  Laterality: N/A;  Cervical Total Disc Replacement Verses ACDF C5-C6    Family History  Problem Relation Age of Onset  . Diabetes    . Cancer     breast  . Hypertension     History  Substance Use Topics  . Smoking status: Never Smoker   . Smokeless tobacco: Never Used  . Alcohol Use: No    Review of Systems  Constitutional: Negative for fever and chills.  HENT: Positive for neck pain. Negative for trouble swallowing and neck stiffness.   Gastrointestinal: Negative for nausea.  Musculoskeletal: Negative for back pain.  Neurological: Negative for dizziness, weakness and numbness.  All other systems reviewed and are negative.    Allergies  Review of patient's allergies indicates no known allergies.  Home Medications   Current Outpatient Rx  Name  Route  Sig  Dispense  Refill  . diphenhydrAMINE (BENADRYL) 25 mg capsule   Oral   Take 25 mg by mouth every 6 (six) hours as needed. For allergies         . cyclobenzaprine (FLEXERIL) 10 MG tablet   Oral   Take 1 tablet (10 mg total) by mouth 3 (three) times daily as needed for muscle spasms.   30 tablet   0    BP 148/92  Pulse 82  Temp(Src) 98.1 F (36.7 C) (Oral)  Resp 20  SpO2 97% Physical Exam  Nursing note and vitals reviewed. Constitutional: He appears well-developed and well-nourished.  HENT:  Head: Normocephalic and atraumatic.  Eyes: Pupils are equal, round, and reactive to light.  Neck: Normal range  of motion. Muscular tenderness present. No spinous process tenderness present.    Lymphadenopathy:    He has no cervical adenopathy.    ED Course  Procedures (including critical care time) Labs Reviewed - No data to display Dg Cervical Spine Complete  05/29/2013   *RADIOLOGY REPORT*  Clinical Data: New posterior neck pain.  CERVICAL SPINE - COMPLETE 4+ VIEW  Comparison: Cervical spine radiographs performed 08/02/2012  Findings: There is no evidence of fracture or subluxation. Vertebral bodies demonstrate normal height and alignment.  The patient is status post disc replacement surgery at C5-C6; this is grossly unchanged in appearance.  Prevertebral soft  tissues are within normal limits.  The provided odontoid view demonstrates no significant abnormality.  The visualized lung apices are clear.  IMPRESSION:  1.  No evidence of fracture or subluxation along the cervical spine. 2.  Postoperative change at C5-C6, stable from the prior study.   Original Report Authenticated By: Tonia Ghent, M.D.   1. Cervical strain, acute, initial encounter     MDM   Dc home to FU with Dr. Lyndel Pleasure, NP 05/29/13 2012

## 2013-05-29 NOTE — ED Provider Notes (Signed)
Medical screening examination/treatment/procedure(s) were performed by non-physician practitioner and as supervising physician I was immediately available for consultation/collaboration.  Sunnie Nielsen, MD 05/29/13 [phone]

## 2013-05-29 NOTE — ED Notes (Signed)
Pt complains of neck pain, no prior history of neck pain, no injury but looked up and felt his neck pop

## 2013-05-30 NOTE — ED Provider Notes (Signed)
Medical screening examination/treatment/procedure(s) were performed by non-physician practitioner and as supervising physician I was immediately available for consultation/collaboration.  Kynleigh Artz, MD 05/30/13 0744 

## 2013-09-23 ENCOUNTER — Other Ambulatory Visit: Payer: Self-pay | Admitting: Family Medicine

## 2013-12-05 ENCOUNTER — Other Ambulatory Visit: Payer: Self-pay | Admitting: Family Medicine

## 2013-12-11 ENCOUNTER — Ambulatory Visit: Payer: 59 | Admitting: Family Medicine

## 2013-12-13 ENCOUNTER — Ambulatory Visit (INDEPENDENT_AMBULATORY_CARE_PROVIDER_SITE_OTHER): Payer: 59 | Admitting: Family Medicine

## 2013-12-13 ENCOUNTER — Encounter: Payer: Self-pay | Admitting: Family Medicine

## 2013-12-13 VITALS — BP 138/84 | HR 76 | Temp 99.1°F | Ht 69.0 in | Wt 192.0 lb

## 2013-12-13 DIAGNOSIS — M545 Low back pain, unspecified: Secondary | ICD-10-CM

## 2013-12-13 DIAGNOSIS — I10 Essential (primary) hypertension: Secondary | ICD-10-CM

## 2013-12-13 LAB — HEPATIC FUNCTION PANEL
ALBUMIN: 4 g/dL (ref 3.5–5.2)
ALK PHOS: 51 U/L (ref 39–117)
ALT: 18 U/L (ref 0–53)
AST: 16 U/L (ref 0–37)
Bilirubin, Direct: 0 mg/dL (ref 0.0–0.3)
Total Bilirubin: 0.6 mg/dL (ref 0.3–1.2)
Total Protein: 7.7 g/dL (ref 6.0–8.3)

## 2013-12-13 LAB — LIPID PANEL
CHOL/HDL RATIO: 3
Cholesterol: 169 mg/dL (ref 0–200)
HDL: 61.9 mg/dL (ref >39.00)
LDL CALC: 95 mg/dL (ref 0–99)
Triglycerides: 59 mg/dL (ref 0.0–149.0)
VLDL: 11.8 mg/dL (ref 0.0–40.0)

## 2013-12-13 LAB — CBC WITH DIFFERENTIAL/PLATELET
BASOS PCT: 0.6 % (ref 0.0–3.0)
Basophils Absolute: 0.1 10*3/uL (ref 0.0–0.1)
EOS PCT: 2.3 % (ref 0.0–5.0)
Eosinophils Absolute: 0.2 10*3/uL (ref 0.0–0.7)
HEMATOCRIT: 44.5 % (ref 39.0–52.0)
HEMOGLOBIN: 14.1 g/dL (ref 13.0–17.0)
LYMPHS ABS: 3 10*3/uL (ref 0.7–4.0)
Lymphocytes Relative: 33.8 % (ref 12.0–46.0)
MCHC: 31.8 g/dL (ref 30.0–36.0)
MCV: 84 fl (ref 78.0–100.0)
MONO ABS: 0.7 10*3/uL (ref 0.1–1.0)
Monocytes Relative: 7.6 % (ref 3.0–12.0)
NEUTROS ABS: 4.9 10*3/uL (ref 1.4–7.7)
NEUTROS PCT: 55.7 % (ref 43.0–77.0)
Platelets: 247 10*3/uL (ref 150.0–400.0)
RBC: 5.29 Mil/uL (ref 4.22–5.81)
RDW: 13.7 % (ref 11.5–14.6)
WBC: 8.8 10*3/uL (ref 4.5–10.5)

## 2013-12-13 LAB — POCT URINALYSIS DIPSTICK
BILIRUBIN UA: NEGATIVE
Blood, UA: NEGATIVE
GLUCOSE UA: NEGATIVE
LEUKOCYTES UA: NEGATIVE
NITRITE UA: NEGATIVE
PH UA: 5.5
Spec Grav, UA: >=1.03
Urobilinogen, UA: 0.2

## 2013-12-13 LAB — BASIC METABOLIC PANEL
BUN: 8 mg/dL (ref 6–23)
CHLORIDE: 105 meq/L (ref 96–112)
CO2: 28 mEq/L (ref 19–32)
Calcium: 9.1 mg/dL (ref 8.4–10.5)
Creatinine, Ser: 1.2 mg/dL (ref 0.4–1.5)
GFR: 85.53 mL/min (ref >60.00)
Glucose, Bld: 101 mg/dL — ABNORMAL HIGH (ref 70–99)
POTASSIUM: 4 meq/L (ref 3.5–5.1)
SODIUM: 139 meq/L (ref 135–145)

## 2013-12-13 LAB — TSH: TSH: 0.29 u[IU]/mL — ABNORMAL LOW (ref 0.35–5.50)

## 2013-12-13 MED ORDER — CYCLOBENZAPRINE HCL 10 MG PO TABS: 10.0000 mg | ORAL_TABLET | Freq: Three times a day (TID) | ORAL | Status: DC | PRN

## 2013-12-13 NOTE — Progress Notes (Signed)
   Subjective:    Patient ID: William Novak, male    DOB: [birth year], 48 y.o.   MRN: [id number]  HPI He is doing well in general. He takes Flexeril occasionally when his back tightens up from work. His BP is stable.    Review of Systems  Constitutional: Negative.   Respiratory: Negative.   Cardiovascular: Negative.   Musculoskeletal: Negative.        Objective:   Physical Exam  Constitutional: He appears well-developed and well-nourished.  Cardiovascular: Normal rate, regular rhythm, normal heart sounds and intact distal pulses.   Pulmonary/Chest: Effort normal and breath sounds normal.          Assessment & Plan:  Refilled meds. Get fasting labs

## 2013-12-14 ENCOUNTER — Telehealth: Payer: Self-pay | Admitting: Family Medicine

## 2013-12-14 NOTE — Telephone Encounter (Signed)
Relevant patient education assigned to patient using Emmi. ° °

## 2014-08-08 ENCOUNTER — Emergency Department (HOSPITAL_COMMUNITY)
Admission: EM | Admit: 2014-08-08 | Discharge: 2014-08-08 | Disposition: A | Discharge status: Home / Self Care | Payer: 59 | Attending: Emergency Medicine | Admitting: Emergency Medicine

## 2014-08-08 ENCOUNTER — Encounter (HOSPITAL_COMMUNITY): Payer: Self-pay | Admitting: Emergency Medicine

## 2014-08-08 DIAGNOSIS — S4980XA Other specified injuries of shoulder and upper arm, unspecified arm, initial encounter: Secondary | ICD-10-CM | POA: Insufficient documentation

## 2014-08-08 DIAGNOSIS — IMO0002 Reserved for concepts with insufficient information to code with codable children: Secondary | ICD-10-CM | POA: Insufficient documentation

## 2014-08-08 DIAGNOSIS — Z862 Personal history of diseases of the blood and blood-forming organs and certain disorders involving the immune mechanism: Secondary | ICD-10-CM | POA: Insufficient documentation

## 2014-08-08 DIAGNOSIS — M62838 Other muscle spasm: Secondary | ICD-10-CM

## 2014-08-08 DIAGNOSIS — Z8739 Personal history of other diseases of the musculoskeletal system and connective tissue: Secondary | ICD-10-CM | POA: Insufficient documentation

## 2014-08-08 DIAGNOSIS — Z8639 Personal history of other endocrine, nutritional and metabolic disease: Secondary | ICD-10-CM | POA: Insufficient documentation

## 2014-08-08 DIAGNOSIS — I10 Essential (primary) hypertension: Secondary | ICD-10-CM | POA: Insufficient documentation

## 2014-08-08 DIAGNOSIS — Y92410 Unspecified street and highway as the place of occurrence of the external cause: Secondary | ICD-10-CM | POA: Diagnosis not present

## 2014-08-08 DIAGNOSIS — S46909A Unspecified injury of unspecified muscle, fascia and tendon at shoulder and upper arm level, unspecified arm, initial encounter: Secondary | ICD-10-CM | POA: Diagnosis not present

## 2014-08-08 DIAGNOSIS — Y9389 Activity, other specified: Secondary | ICD-10-CM | POA: Insufficient documentation

## 2014-08-08 MED ORDER — NAPROXEN 500 MG PO TABS: 500.0000 mg | ORAL_TABLET | Freq: Two times a day (BID) | ORAL | Status: DC

## 2014-08-08 MED ORDER — NAPROXEN 500 MG PO TABS
500.0000 mg | ORAL_TABLET | Freq: Once | ORAL | Status: AC
  Administered 2014-08-08: 500 mg via ORAL
  Filled 2014-08-08: qty 1
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
## 2014-08-08 NOTE — Discharge Instructions (Signed)
Please call your doctor for a followup appointment within 24-48 hours. When you talk to your doctor please let them know that you were seen in the emergency department and have them acquire all of your records so that they can discuss the findings with you and formulate a treatment plan to fully care for your new and ongoing problems. ° °

## 2014-08-08 NOTE — ED Notes (Signed)
Pt escorted to discharge window. Pt verbalized understanding discharge instructions. In no acute distress.  
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
## 2014-08-08 NOTE — ED Notes (Signed)
Bed: WA13 Expected date:  Expected time:  Means of arrival:  Comments: ems  

## 2015-01-11 ENCOUNTER — Other Ambulatory Visit: Payer: Self-pay | Admitting: Family Medicine

## 2015-01-17 ENCOUNTER — Ambulatory Visit (INDEPENDENT_AMBULATORY_CARE_PROVIDER_SITE_OTHER): Payer: 59 | Admitting: Family Medicine

## 2015-01-17 ENCOUNTER — Encounter: Payer: Self-pay | Admitting: Family Medicine

## 2015-01-17 VITALS — BP 151/87 | HR 79 | Temp 98.4°F | Ht 69.0 in | Wt 192.0 lb

## 2015-01-17 DIAGNOSIS — Z87820 Personal history of traumatic brain injury: Secondary | ICD-10-CM

## 2015-01-17 DIAGNOSIS — M5442 Lumbago with sciatica, left side: Secondary | ICD-10-CM

## 2015-01-17 DIAGNOSIS — I10 Essential (primary) hypertension: Secondary | ICD-10-CM

## 2015-01-17 DIAGNOSIS — M5441 Lumbago with sciatica, right side: Secondary | ICD-10-CM

## 2015-01-17 MED ORDER — CYCLOBENZAPRINE HCL 10 MG PO TABS: 10.0000 mg | ORAL_TABLET | Freq: Three times a day (TID) | ORAL | Status: DC | PRN
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
## 2015-01-17 NOTE — Progress Notes (Signed)
Pre visit review using our clinic review tool, if applicable. No additional management support is needed unless otherwise documented below in the visit note. 

## 2015-03-10 ENCOUNTER — Other Ambulatory Visit: Payer: 59

## 2015-03-17 ENCOUNTER — Encounter: Payer: Self-pay | Admitting: Family Medicine

## 2015-03-17 ENCOUNTER — Ambulatory Visit (INDEPENDENT_AMBULATORY_CARE_PROVIDER_SITE_OTHER): Payer: 59 | Admitting: Family Medicine

## 2015-03-17 VITALS — BP 131/88 | HR 71 | Temp 98.7°F | Ht 69.0 in | Wt 191.0 lb

## 2015-03-17 DIAGNOSIS — Z0000 Encounter for general adult medical examination without abnormal findings: Secondary | ICD-10-CM

## 2015-03-17 DIAGNOSIS — G5620 Lesion of ulnar nerve, unspecified upper limb: Secondary | ICD-10-CM

## 2015-03-17 LAB — CBC WITH DIFFERENTIAL/PLATELET
BASOS PCT: 0.7 % (ref 0.0–3.0)
Basophils Absolute: 0.1 10*3/uL (ref 0.0–0.1)
EOS ABS: 0.2 10*3/uL (ref 0.0–0.7)
EOS PCT: 3.1 % (ref 0.0–5.0)
HCT: 42 % (ref 39.0–52.0)
HEMOGLOBIN: 13.6 g/dL (ref 13.0–17.0)
LYMPHS PCT: 45.6 % (ref 12.0–46.0)
Lymphs Abs: 3.4 10*3/uL (ref 0.7–4.0)
MCHC: 32.4 g/dL (ref 30.0–36.0)
MCV: 80.8 fl (ref 78.0–100.0)
MONO ABS: 0.6 10*3/uL (ref 0.1–1.0)
MONOS PCT: 7.9 % (ref 3.0–12.0)
NEUTROS ABS: 3.2 10*3/uL (ref 1.4–7.7)
Neutrophils Relative %: 42.7 % — ABNORMAL LOW (ref 43.0–77.0)
Platelets: 242 10*3/uL (ref 150.0–400.0)
RBC: 5.2 Mil/uL (ref 4.22–5.81)
RDW: 14.3 % (ref 11.5–15.5)
WBC: 7.4 10*3/uL (ref 4.0–10.5)

## 2015-03-17 LAB — BASIC METABOLIC PANEL
BUN: 9 mg/dL (ref 6–23)
CALCIUM: 9.5 mg/dL (ref 8.4–10.5)
CHLORIDE: 104 meq/L (ref 96–112)
CO2: 30 meq/L (ref 19–32)
Creatinine, Ser: 1.15 mg/dL (ref 0.40–1.50)
GFR: 86.79 mL/min (ref >60.00)
Glucose, Bld: 103 mg/dL — ABNORMAL HIGH (ref 70–99)
Potassium: 4.8 mEq/L (ref 3.5–5.1)
Sodium: 141 mEq/L (ref 135–145)

## 2015-03-17 LAB — POCT URINALYSIS DIPSTICK
Bilirubin, UA: NEGATIVE
Blood, UA: NEGATIVE
Glucose, UA: NEGATIVE
Ketones, UA: NEGATIVE
Leukocytes, UA: NEGATIVE
NITRITE UA: NEGATIVE
PH UA: 5.5
Protein, UA: NEGATIVE
Spec Grav, UA: >=1.03
Urobilinogen, UA: 0.2

## 2015-03-17 LAB — HEPATIC FUNCTION PANEL
ALK PHOS: 50 U/L (ref 39–117)
ALT: 13 U/L (ref 0–53)
AST: 14 U/L (ref 0–37)
Albumin: 4 g/dL (ref 3.5–5.2)
BILIRUBIN DIRECT: 0.1 mg/dL (ref 0.0–0.3)
TOTAL PROTEIN: 7.2 g/dL (ref 6.0–8.3)
Total Bilirubin: 0.3 mg/dL (ref 0.2–1.2)

## 2015-03-17 LAB — LIPID PANEL
Cholesterol: 174 mg/dL (ref 0–200)
HDL: 59.7 mg/dL (ref >39.00)
LDL Cholesterol: 98 mg/dL (ref 0–99)
NONHDL: 114.3
TRIGLYCERIDES: 80 mg/dL (ref 0.0–149.0)
Total CHOL/HDL Ratio: 3
VLDL: 16 mg/dL (ref 0.0–40.0)

## 2015-03-17 LAB — TSH: TSH: 0.83 u[IU]/mL (ref 0.35–4.50)

## 2015-03-17 LAB — PSA: PSA: 0.73 ng/mL (ref 0.10–4.00)

## 2015-03-17 NOTE — Progress Notes (Signed)
   Subjective:    Patient ID: William Novak, male    DOB: 1966/04/15, 49 y.o.   MRN: [id number]  HPI 49 yr old male for a cpx. He feels well except for one complaint. Over the past year he has had frequent pains in both elbows which radiate down the forearms to both hands. 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 No neck or back pain. These arm pains have been much worse the past 3 months. He says he had nerve conduction studies done on his arms, but I cannot locate these in his record.    Review of Systems  Constitutional: Negative.   HENT: Negative.   Eyes: Negative.   Respiratory: Negative.   Cardiovascular: Negative.   Gastrointestinal: Negative.   Genitourinary: Negative.   Musculoskeletal: Negative.   Skin: Negative.   Neurological: Negative.   Psychiatric/Behavioral: Negative.        Objective:   Physical Exam  Constitutional: He is oriented to person, place, and time. He appears well-developed and well-nourished. No distress.  HENT:  Head: Normocephalic and atraumatic.  Right Ear: External ear normal.  Left Ear: External ear normal.  Nose: Nose normal.  Mouth/Throat: Oropharynx is clear and moist. No oropharyngeal exudate.  Eyes: Conjunctivae and EOM are normal. Pupils are equal, round, and reactive to light. Right eye exhibits no discharge. Left eye exhibits no discharge. No scleral icterus.  Neck: Neck supple. No JVD present. No tracheal deviation present. No thyromegaly present.  Cardiovascular: Normal rate, regular rhythm, normal heart sounds and intact distal pulses.  Exam reveals no gallop and no friction rub.   No murmur heard. Pulmonary/Chest: Effort normal and breath sounds normal. No respiratory distress. He has no wheezes. He has no rales. He exhibits no tenderness.  Abdominal: Soft. Bowel sounds are normal. He exhibits no distension and no mass. There is no tenderness. There is no rebound and  no guarding.  Genitourinary: Rectum normal, prostate normal and penis normal. Guaiac negative stool. No penile tenderness.  Musculoskeletal: Normal range of motion. He exhibits no edema or tenderness.  Lymphadenopathy:    He has no cervical adenopathy.  Neurological: He is alert and oriented to person, place, and time. He has normal reflexes. No cranial nerve deficit. He exhibits normal muscle tone. Coordination normal.  Skin: Skin is warm and dry. No rash noted. He is not diaphoretic. No erythema. No pallor.  Psychiatric: He has a normal mood and affect. His behavior is normal. Judgment and thought content normal.          Assessment & Plan:  Well exam. Get fasting labs. He seems to have cubital tunnel syndrome so we will refer him to Orthopedics.

## 2015-03-17 NOTE — Progress Notes (Signed)
Pre visit review using our clinic review tool, if applicable. No additional management support is needed unless otherwise documented below in the visit note. 

## 2015-10-16 ENCOUNTER — Telehealth: Payer: Self-pay | Admitting: Family Medicine

## 2015-10-16 MED ORDER — TRAMADOL HCL 50 MG PO TABS: 50.0000 mg | ORAL_TABLET | Freq: Four times a day (QID) | ORAL | Status: DC | PRN

## 2015-10-16 NOTE — Telephone Encounter (Signed)
Pt has an appointment to see dr Shon Batonbrooks 10/27/15 for neck and arm pain. Pt would like something for the pain. [facility] [address]

## 2015-10-16 NOTE — Telephone Encounter (Signed)
Call in Tramadol 50 mg to take 1-2 tabs every 6 hours prn pain, #60 with 2 rf

## 2015-10-16 NOTE — Telephone Encounter (Signed)
Left message on personal voicemail Rx for pain medication was called into the pharmacy.

## 2015-10-28 ENCOUNTER — Other Ambulatory Visit: Payer: Self-pay | Admitting: Physician Assistant

## 2015-10-28 DIAGNOSIS — Z9889 Other specified postprocedural states: Secondary | ICD-10-CM

## 2015-11-03 ENCOUNTER — Ambulatory Visit
Admission: RE | Admit: 2015-11-03 | Discharge: 2015-11-03 | Disposition: A | Discharge status: Home / Self Care | Payer: 59 | Source: Ambulatory Visit | Attending: Physician Assistant | Admitting: Physician Assistant

## 2015-11-03 VITALS — BP 147/90 | HR 68

## 2015-11-03 DIAGNOSIS — M5412 Radiculopathy, cervical region: Secondary | ICD-10-CM

## 2015-11-03 DIAGNOSIS — Z9889 Other specified postprocedural states: Secondary | ICD-10-CM

## 2015-11-03 MED ORDER — DIAZEPAM 5 MG PO TABS
10.0000 mg | ORAL_TABLET | Freq: Once | ORAL | Status: AC
  Administered 2015-11-03: 10 mg via ORAL

## 2015-11-03 MED ORDER — IOHEXOL 300 MG/ML  SOLN
10.0000 mL | Freq: Once | INTRAMUSCULAR | Status: AC | PRN
  Administered 2015-11-03: 10 mL via INTRATHECAL

## 2015-11-03 NOTE — Progress Notes (Signed)
Pt states he has not taken Tramadol since Wednesday.  Discharge instructions explained to pt.

## 2015-11-03 NOTE — Discharge Instructions (Signed)
Myelogram Discharge Instructions  1. Go home and rest quietly for the next 24 hours.  It is important to lie flat for the next 24 hours.  Get up only to go to the restroom.  You may lie in the bed or on a couch on your back, your stomach, your left side or your right side.  You may have one pillow under your head.  You may have pillows between your knees while you are on your side or under your knees while you are on your back.  2. DO NOT drive today.  Recline the seat as far back as it will go, while still wearing your seat belt, on the way home.  3. You may get up to go to the bathroom as needed.  You may sit up for 10 minutes to eat.  You may resume your normal diet and medications unless otherwise indicated.  Drink lots of extra fluids today and tomorrow.  4. The incidence of headache, nausea, or vomiting is about 5% (one in 20 patients).  If you develop a headache, lie flat and drink plenty of fluids until the headache goes away.  Caffeinated beverages may be helpful.  If you develop severe nausea and vomiting or a headache that does not go away with flat bed rest, call [phone].  5. You may resume normal activities after your 24 hours of bed rest is over; however, do not exert yourself strongly or do any heavy lifting tomorrow. If when you get up you have a headache when standing, go back to bed and force fluids for another 24 hours.  6. Call your physician for a follow-up appointment.  The results of your myelogram will be sent directly to your physician by the following day.  7. If you have any questions or if complications develop after you arrive home, please call [phone].  Discharge instructions have been explained to the patient.  The patient, or the person responsible for the patient, fully understands these instructions.       May resume Tramadol on Dec. 20, 2016, after 11:30 am.

## 2015-11-06 ENCOUNTER — Telehealth: Payer: Self-pay

## 2015-11-06 ENCOUNTER — Other Ambulatory Visit: Payer: Self-pay | Admitting: Orthopedic Surgery

## 2015-11-06 DIAGNOSIS — G971 Other reaction to spinal and lumbar puncture: Secondary | ICD-10-CM

## 2015-11-06 NOTE — Telephone Encounter (Signed)
I called Mr. William Novak to see how his positional headache is today.  He states he got up at 0600, made some coffee and took a shower.  He has not been on bedrest this morning through this time, though is headed to bed now until 1300.  He denies any headache today so far.  I will call him this afternoon in follow up.  jkl

## 2015-11-07 ENCOUNTER — Ambulatory Visit
Admission: RE | Admit: 2015-11-07 | Discharge: 2015-11-07 | Disposition: A | Discharge status: Home / Self Care | Payer: 59 | Source: Ambulatory Visit | Attending: Orthopedic Surgery | Admitting: Orthopedic Surgery

## 2015-11-07 DIAGNOSIS — G971 Other reaction to spinal and lumbar puncture: Secondary | ICD-10-CM

## 2015-11-07 MED ORDER — IOHEXOL 180 MG/ML  SOLN
1.0000 mL | Freq: Once | INTRAMUSCULAR | Status: AC | PRN
  Administered 2015-11-07: 1 mL via EPIDURAL

## 2015-11-07 NOTE — Discharge Instructions (Signed)

## 2015-11-07 NOTE — Progress Notes (Signed)
1028 20 cc's of [facility]. Site is unremarkable and pt tolerated procedure well.

## 2016-03-07 ENCOUNTER — Other Ambulatory Visit: Payer: Self-pay | Admitting: Family Medicine

## 2016-07-27 ENCOUNTER — Other Ambulatory Visit: Payer: Self-pay | Admitting: Family Medicine

## 2016-08-03 ENCOUNTER — Encounter: Payer: Self-pay | Admitting: Family Medicine

## 2016-08-03 ENCOUNTER — Ambulatory Visit (INDEPENDENT_AMBULATORY_CARE_PROVIDER_SITE_OTHER): Payer: 59 | Admitting: Family Medicine

## 2016-08-03 VITALS — BP 138/88 | HR 80 | Temp 98.7°F | Ht 69.0 in | Wt 180.0 lb

## 2016-08-03 DIAGNOSIS — I10 Essential (primary) hypertension: Secondary | ICD-10-CM | POA: Diagnosis not present

## 2016-08-03 DIAGNOSIS — G8929 Other chronic pain: Secondary | ICD-10-CM | POA: Diagnosis not present

## 2016-08-03 DIAGNOSIS — M542 Cervicalgia: Secondary | ICD-10-CM | POA: Diagnosis not present

## 2016-08-03 MED ORDER — CYCLOBENZAPRINE HCL 10 MG PO TABS: 10.0000 mg | ORAL_TABLET | Freq: Three times a day (TID) | ORAL | 5 refills | Status: DC | PRN

## 2016-08-03 MED ORDER — DICLOFENAC SODIUM 75 MG PO TBEC: 75.0000 mg | DELAYED_RELEASE_TABLET | Freq: Two times a day (BID) | ORAL | 5 refills | Status: DC

## 2016-08-03 NOTE — Progress Notes (Signed)
   Subjective:    Patient ID: William Novak, male    DOB: 06-13-1966, 50 y.o.   MRN: [id number]  HPI Here for refills on Flexeril and to discuss stiffness and pain in the neck. He has had increasing trouble with this in the past 2 months, and his ROM has significantly reduced. 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    Review of Systems  Constitutional: Negative.   Respiratory: Negative.   Cardiovascular: Negative.   Musculoskeletal: Positive for neck pain and neck stiffness.  Neurological: Negative.        Objective:   Physical Exam  Constitutional: He is oriented to person, place, and time. He appears well-developed and well-nourished. No distress.  Cardiovascular: Normal rate, regular rhythm, normal heart sounds and intact distal pulses.   Pulmonary/Chest: Effort normal and breath sounds normal.  Musculoskeletal:  His neck is not tender but ROM is reduced, especially with regard to rotation   Neurological: He is alert and oriented to person, place, and time.          Assessment & Plan:  Neck spasms, use Flexeril and Diclofenac prn. Send for PT as well. His HTN is stable.  Nelwyn SalisburyFRY,Jazmina Muhlenkamp A, MD

## 2016-08-03 NOTE — Progress Notes (Signed)
Pre visit review using our clinic review tool, if applicable. No additional management support is needed unless otherwise documented below in the visit note. 

## 2016-10-15 ENCOUNTER — Ambulatory Visit (INDEPENDENT_AMBULATORY_CARE_PROVIDER_SITE_OTHER): Payer: 59

## 2016-10-15 ENCOUNTER — Ambulatory Visit (INDEPENDENT_AMBULATORY_CARE_PROVIDER_SITE_OTHER): Payer: 59 | Admitting: Emergency Medicine

## 2016-10-15 VITALS — BP 132/102 | HR 69 | Temp 98.8°F | Resp 16 | Ht 69.0 in | Wt 181.8 lb

## 2016-10-15 DIAGNOSIS — M545 Low back pain, unspecified: Secondary | ICD-10-CM

## 2016-10-15 NOTE — Patient Instructions (Addendum)
     IF you received an x-ray today, you will receive an invoice from [facility]. Please contact [facility] at [phone] with questions or concerns regarding your invoice.   IF you received labwork today, you will receive an invoice from [facility]. Please contact [facility] at [phone] with questions or concerns regarding your invoice.   Our billing staff will not be able to assist you with questions regarding bills from these companies.  You will be contacted with the lab results as soon as they are available. The fastest way to get your results is to activate your My Chart account. Instructions are located on the last page of this paperwork. If you have not heard from us regarding the results in 2 weeks, please contact this office.     Low back pain Back Pain, Adult Introduction Back pain is very common. The pain often gets better over time. The cause of back pain is usually not dangerous. Most people can learn to manage their back pain on their own. Follow these instructions at home: Watch your back pain for any changes. The following actions may help to lessen any pain you are feeling:  Stay active. Start with short walks on flat ground if you can. Try to walk farther each day.  Exercise regularly as told by your doctor. Exercise helps your back heal faster. It also helps avoid future injury by keeping your muscles strong and flexible.  Do not sit, drive, or stand in one place for more than 30 minutes.  Do not stay in bed. Resting more than 1-2 days can slow down your recovery.  Be careful when you bend or lift an object. Use good form when lifting:  Bend at your knees.  Keep the object close to your body.  Do not twist.  Sleep on a firm mattress. Lie on your side, and bend your knees. If you lie on your back, put a pillow under your knees.  Take medicines only as told by your doctor.  Put ice on the injured  area.  Put ice in a plastic bag.  Place a towel between your skin and the bag.  Leave the ice on for 20 minutes, 2-3 times a day for the first 2-3 days. After that, you can switch between ice and heat packs.  Avoid feeling anxious or stressed. Find good ways to deal with stress, such as exercise.  Maintain a healthy weight. Extra weight puts stress on your back. Contact a doctor if:  You have pain that does not go away with rest or medicine.  You have worsening pain that goes down into your legs or buttocks.  You have pain that does not get better in one week.  You have pain at night.  You lose weight.  You have a fever or chills. Get help right away if:  You cannot control when you poop (bowel movement) or pee (urinate).  Your arms or legs feel weak.  Your arms or legs lose feeling (numbness).  You feel sick to your stomach (nauseous) or throw up (vomit).  You have belly (abdominal) pain.  You feel like you may pass out (faint). This information is not intended to replace advice given to you by your health care provider. Make sure you discuss any questions you have with your health care provider. Document Released: 04/19/2008 Document Revised: 04/08/2016 Document Reviewed: 03/05/2014  2017 Elsevier
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
## 2016-12-09 ENCOUNTER — Emergency Department (HOSPITAL_COMMUNITY): Payer: 59

## 2016-12-09 ENCOUNTER — Encounter (HOSPITAL_COMMUNITY): Payer: Self-pay | Admitting: Emergency Medicine

## 2016-12-09 ENCOUNTER — Emergency Department (HOSPITAL_COMMUNITY)
Admission: EM | Admit: 2016-12-09 | Discharge: 2016-12-09 | Disposition: A | Discharge status: Home / Self Care | Payer: 59 | Attending: Emergency Medicine | Admitting: Emergency Medicine

## 2016-12-09 DIAGNOSIS — M545 Low back pain, unspecified: Secondary | ICD-10-CM

## 2016-12-09 DIAGNOSIS — Z79899 Other long term (current) drug therapy: Secondary | ICD-10-CM | POA: Diagnosis not present

## 2016-12-09 DIAGNOSIS — I10 Essential (primary) hypertension: Secondary | ICD-10-CM | POA: Diagnosis not present

## 2016-12-09 DIAGNOSIS — M546 Pain in thoracic spine: Secondary | ICD-10-CM | POA: Diagnosis not present

## 2016-12-09 MED ORDER — HYDROCODONE-ACETAMINOPHEN 5-325 MG PO TABS: 1.0000 | ORAL_TABLET | Freq: Four times a day (QID) | ORAL | 0 refills | Status: DC | PRN

## 2016-12-09 MED ORDER — ACETAMINOPHEN 325 MG PO TABS
650.0000 mg | ORAL_TABLET | Freq: Once | ORAL | Status: AC
  Administered 2016-12-09: 650 mg via ORAL
  Filled 2016-12-09: qty 2

## 2016-12-09 MED ORDER — PREDNISONE 10 MG (21) PO TBPK: 10.0000 mg | ORAL_TABLET | Freq: Every day | ORAL | 0 refills | Status: DC

## 2016-12-09 MED ORDER — NAPROXEN 375 MG PO TABS: 375.0000 mg | ORAL_TABLET | Freq: Two times a day (BID) | ORAL | 0 refills | Status: DC

## 2016-12-09 NOTE — Discharge Instructions (Signed)
Your x-ray shows signs of a bulging disc. I'm giving you a short course of pain medicine. Please take the naproxen up to twice a day for no more than 7 days. Do not take extra Motrin or ibuprofen with this medication. I have also given you a steroid pack. Please take as prescribed for the next 12 days. You need to follow up with your primary care doctor in the next 1-2 days for a possible outpatient MRI as they see indicated. Return to the ED if he developed any numbness in your groin, loss of bowel or bladder, urinary retention, fevers, worsening pain or for any reason.

## 2016-12-09 NOTE — ED Provider Notes (Signed)
[facility]-EMERGENCY DEPT Provider Note   CSN: [id number] Arrival date & time: 12/09/16  [id number]   By signing my name below, I, William Novak, attest that this documentation has been prepared under the direction and in the presence of William Kubayler Gardy Montanari, PA-C. Electronically Signed: Teofilo PodMatthew P. Novak, ED Scribe. 12/09/2016. 11:16 AM.   History   Chief Complaint Chief Complaint  Patient presents with  . Back Pain    The history is provided by the patient. No language interpreter was used.   HPI Comments:  William Novak is a 51 y.o. male who presents to the Emergency Department complaining of constant lower back pain x 3 days. Pt complains pain radiates to his right buttocks. Pt states that he has had difficulty getting out of bed, and difficult changing positions at work. He describes the pain as "tense spasms" that wrap around his lower back and to his sides. Moving makes the pain worse. Pt reports chronic pain from a previous lumbar injury that occurred several years ago which he manages with cyclobenzaprine. Pt states that he took 1 extra dose of his medication 3 days ago because his back and abdominal wall muscles were worsening, but it did not provide relief. Pt denies fever, chills,  urinary symptoms, nausea, vomiting, fever, bowel/bladder incontinence, saddle anesthesia, leg numbness, hx of ivdu, hx of cancer, night sweats, cough, or rhinorrhea.    Past Medical History:  Diagnosis Date  . Angioedema   . Arthritis    Generalized  . Concussion   . Hypertension   . Hypoglycemia   . Low back pain     Patient Active Problem List   Diagnosis Date Noted  . Hx of concussion 01/17/2015  . Cervical radiculopathy 07/24/2012  . ANGIOEDEMA 05/14/2010  . ARTHRITIS, GENERALIZED 06/12/2008  . HYPOGLYCEMIA 01/03/2008  . Essential hypertension 01/03/2008  . LOW BACK PAIN 01/03/2008    Past Surgical History:  Procedure Laterality Date  . ANTERIOR CERVICAL DECOMP/DISCECTOMY FUSION   08/02/2012   Procedure: ANTERIOR CERVICAL DECOMPRESSION/DISCECTOMY FUSION 1 LEVEL;  Surgeon: Venita Lickahari Brooks, MD;  Location: [facility] OR;  Service: Orthopedics;  Laterality: N/A;  Cervical Total Disc Replacement Verses ACDF C5-C6   . fistula removal    . hemmorhoidectomy         Home Medications    Prior to Admission medications   Medication Sig Start Date End Date Taking? Authorizing Provider  cetirizine (ZYRTEC) 10 MG tablet Take 10 mg by mouth as needed for allergies.    Historical Provider, MD  cyclobenzaprine (FLEXERIL) 10 MG tablet Take 1 tablet (10 mg total) by mouth 3 (three) times daily as needed. for muscle spams 08/03/16   Nelwyn SalisburyStephen A Fry, MD  diclofenac (VOLTAREN) 75 MG EC tablet Take 1 tablet (75 mg total) by mouth 2 (two) times daily. Patient not taking: Reported on 10/15/2016 08/03/16   Nelwyn SalisburyStephen A Fry, MD  diphenhydrAMINE (BENADRYL) 25 mg capsule Take 25 mg by mouth every 6 (six) hours as needed. For allergies    Historical Provider, MD    Family History Family History  Problem Relation Age of Onset  . Diabetes    . Cancer      breast  . Hypertension    . Diabetes Mother   . Cancer Mother   . Heart attack Father     Social History Social History  Substance Use Topics  . Smoking status: Never Smoker  . Smokeless tobacco: Never Used  . Alcohol use No     Allergies  Patient has no known allergies.   Review of Systems Review of Systems  HENT: Negative for rhinorrhea.   Respiratory: Negative for cough.   Gastrointestinal: Negative for nausea and vomiting.  Genitourinary: Negative for dysuria, hematuria, penile pain and testicular pain.  Musculoskeletal: Positive for back pain and myalgias.  Neurological: Negative for numbness.  All other systems reviewed and are negative.    Physical Exam Updated Vital Signs BP (!) 140/103   Pulse 83   Temp 98.7 F (37.1 C) (Oral)   Resp 16   SpO2 97%   Physical Exam  Constitutional: He is oriented to person, place, and  time. He appears well-developed and well-nourished. No distress.  HENT:  Head: Normocephalic and atraumatic.  Eyes: Conjunctivae and EOM are normal. Pupils are equal, round, and reactive to light.  Neck: Normal range of motion. Neck supple.  No midline C-spine tenderness.  Cardiovascular: Normal rate, regular rhythm, normal heart sounds and intact distal pulses.   Pulmonary/Chest: Effort normal and breath sounds normal. He exhibits no tenderness.  Abdominal: Soft. Bowel sounds are normal. He exhibits no distension. There is no tenderness. There is no rebound and no guarding.  Musculoskeletal: Normal range of motion.  No midline T-spine or L-spine tenderness. No deformities or step-offs noted. No paraspinal tenderness. Patient states pain is only when he moves. Tense musculature noted. Patient with strength 5 out of 5 in lower extremities. Sensation intact to sharp and dull to lower extremities and inner thighs including groin. Cap refill normal. DP pulses are 2+ bilaterally. Patient with full range of motion. Able to ambulate with normal gait. Negative straight leg raise test bilaterally  Lymphadenopathy:    He has no cervical adenopathy.  Neurological: He is alert and oriented to person, place, and time.  Strength 5 out of 5 in lower extremities. Sensation intact.  Skin: Skin is warm and dry. Capillary refill takes less than 2 seconds.  Psychiatric: He has a normal mood and affect.  Nursing note and vitals reviewed.    ED Treatments / Results  DIAGNOSTIC STUDIES:  Oxygen Saturation is 97% on RA, normal by my interpretation.    COORDINATION OF CARE:  11:10 AM Discussed treatment plan with pt at bedside and pt agreed to plan.   Labs (all labs ordered are listed, but only abnormal results are displayed) Labs Reviewed - No data to display  EKG  EKG Interpretation None       Radiology Dg Thoracic Spine 2 View  Result Date: 12/09/2016 CLINICAL DATA:  Pt reports lower back  (upper lumbar) and mid back ( lower thoracic) pain radiating around bilateral flanks/abd area. Hx of lower back issues and "weak muscles in stomach and waist area." EXAM: THORACIC SPINE 2 VIEWS COMPARISON:  None. FINDINGS: There mild degenerative changes in the mid thoracic spine. No acute fracture or subluxation identified. Previous disc replacement at C5-6. IMPRESSION: No evidence for acute  abnormality. Electronically Signed   By: Norva Pavlov M.D.   On: 12/09/2016 12:09   Dg Lumbar Spine Complete  Result Date: 12/09/2016 CLINICAL DATA:  Pt reports lower back (upper lumbar) and mid back ( lower thoracic) pain radiating around bilateral flanks/abd area. Hx of lower back issues and "weak muscles in stomach and waist area." EXAM: LUMBAR SPINE - COMPLETE 4+ VIEW COMPARISON:  10/15/2016 FINDINGS: Normal alignment of the lumbar spine. Faint calcification is suspicious for disc protrusion at L3-4. There is mild disc height loss at L3-4. No acute fracture or traumatic subluxation. No suspicious  lytic or blastic lesions are identified. Moderate to large stool burden. IMPRESSION: 1. Disc height loss and findings suspicious for disc protrusion at L3-4. 2.  No evidence for acute  abnormality. 3. Moderate to large stool burden. Electronically Signed   By: Norva Pavlov M.D.   On: 12/09/2016 12:07    Procedures Procedures (including critical care time)  Medications Ordered in ED Medications  acetaminophen (TYLENOL) tablet 650 mg (650 mg Oral Given 12/09/16 1124)     Initial Impression / Assessment and Plan / ED Course  Patient with back pain.  No neurological deficits and normal neuro exam.  Patient is ambulatory.  No loss of bowel or bladder control.  No concern for cauda equina.  No fever, night sweats, weight loss, h/o cancer, IVDA, no recent procedure to back. No urinary symptoms suggestive of UTI.  X-ray shows slight disc height loss the lumbar L3 and L4. Likely disc protrusion. Patient has no red  flag symptoms. He is ambulatory. The patient would benefit from an outpatient MRI. I have instructed him to follow-up with his primary care doctor. I have sent primary care doctor a message in Epic the patient will likely benefit from outpatient MRI. Low suspicion for any spinal cord impingement. Patient will be given steroid Dosepak, small dose of pain medicine, naproxen. Supportive care and return precaution discussed. Appears safe for discharge at this time. Follow up as indicated in discharge paperwork.   I have reviewed the triage vital signs and the nursing notes.  Pertinent labs & imaging results that were available during my care of the patient were reviewed by me and considered in my medical decision making (see chart for details).      Final Clinical Impressions(s) / ED Diagnoses   Final diagnoses:  Acute right-sided low back pain without sciatica    New Prescriptions Discharge Medication List as of 12/09/2016 12:38 PM    START taking these medications   Details  HYDROcodone-acetaminophen (NORCO) 5-325 MG tablet Take 1 tablet by mouth every 6 (six) hours as needed., Starting Thu 12/09/2016, Print    naproxen (NAPROSYN) 375 MG tablet Take 1 tablet (375 mg total) by mouth 2 (two) times daily., Starting Thu 12/09/2016, Print    predniSONE (STERAPRED UNI-PAK 21 TAB) 10 MG (21) TBPK tablet Take 1 tablet (10 mg total) by mouth daily. Take 6 tabs by mouth daily  for 2 days, then 5 tabs for 2 days, then 4 tabs for 2 days, then 3 tabs for 2 days, 2 tabs for 2 days, then 1 tab by mouth daily for 2 days, Starting Thu 12/09/2016, Print      I personally performed the services described in this documentation, which was scribed in my presence. The recorded information has been reviewed and is accurate.     Rise Mu, PA-C 12/09/16 1312    Jacalyn Lefevre, MD 12/09/16 [id number]

## 2016-12-09 NOTE — ED Triage Notes (Addendum)
Pt reports lower back pain radiating around bilateral flanks/abd area. Hx of lower back issues and "weak muscles in stomach and waist area." Area of pain typical for his back pain; not new area of pain. Pain decreased with flexeril. Pain increased with movement. No n/v/d or dysuria.

## 2016-12-09 NOTE — ED Notes (Signed)
Bed: WTR9 Expected date:  Expected time:  Means of arrival:  Comments: 

## 2016-12-13 ENCOUNTER — Encounter: Payer: Self-pay | Admitting: Family Medicine

## 2016-12-13 ENCOUNTER — Ambulatory Visit (INDEPENDENT_AMBULATORY_CARE_PROVIDER_SITE_OTHER): Payer: 59 | Admitting: Family Medicine

## 2016-12-13 VITALS — BP 160/106 | HR 70 | Temp 98.1°F | Ht 69.0 in | Wt 191.0 lb

## 2016-12-13 DIAGNOSIS — M5440 Lumbago with sciatica, unspecified side: Secondary | ICD-10-CM

## 2016-12-13 MED ORDER — HYDROCODONE-ACETAMINOPHEN 5-325 MG PO TABS: 1.0000 | ORAL_TABLET | Freq: Four times a day (QID) | ORAL | 0 refills | Status: DC | PRN

## 2016-12-13 NOTE — Progress Notes (Signed)
Pre visit review using our clinic review tool, if applicable. No additional management support is needed unless otherwise documented below in the visit note. 

## 2016-12-13 NOTE — Progress Notes (Signed)
   Subjective:    Patient ID: William Novak, male    DOB: 06-27-1966, 51 y.o.   MRN: [id number]  HPI Here to follow up an ER visit on 12-09-16 for right sided low back pain. He has had this off and on for 3 years, but that day it flared up worse then ever. No recent trauma. The pain is sharp and radiates into the tight buttock. No numbness or weakness in the leg. At the ER a plain Xray of the lumbar spine showed a loss of disc space at the L3-4 level. They told him to follow up with us for a possible MRI. He was given a steroid taper pack and naproxen and hydrocodone. The pain is now better but he still gets sharp pains if her turns certain ways. He has been out of work since that day. Of note he had cervical spine surgery in the past with Dr. Venita Lickahari Brooks.    Review of Systems  Constitutional: Negative.   Gastrointestinal: Negative.   Genitourinary: Negative.   Musculoskeletal: Positive for back pain.       Objective:   Physical Exam  Constitutional: He appears well-developed and well-nourished. No distress.  Cardiovascular: Normal rate, regular rhythm, normal heart sounds and intact distal pulses.   Pulmonary/Chest: Effort normal and breath sounds normal.  Musculoskeletal:  Tender in the right lower back with some spasm. Negative SLR. He has good ROM with flexion and extension, but rotation of the spine causes a fair amount of pain.           Assessment & Plan:  Low back pain. We wrote for some more hydrocodone to use for pain, especially at night. Instead of Naproxen, he will get back on Diclofenac bid. Set up a lumbar spine MRI soon. He will try to return to work tomorrow.                                Gershon CraneStephen Zanya Lindo, MD

## 2016-12-22 ENCOUNTER — Ambulatory Visit
Admission: RE | Admit: 2016-12-22 | Discharge: 2016-12-22 | Disposition: A | Discharge status: Home / Self Care | Payer: 59 | Source: Ambulatory Visit | Attending: Family Medicine | Admitting: Family Medicine

## 2016-12-22 DIAGNOSIS — M5126 Other intervertebral disc displacement, lumbar region: Secondary | ICD-10-CM | POA: Diagnosis not present

## 2016-12-22 DIAGNOSIS — M5440 Lumbago with sciatica, unspecified side: Secondary | ICD-10-CM

## 2016-12-23 ENCOUNTER — Telehealth: Payer: Self-pay | Admitting: Family Medicine

## 2016-12-23 NOTE — Telephone Encounter (Signed)
I will refer him back to Dr. Venita Lickahari Brooks the spinal surgeon ASAP
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
## 2016-12-23 NOTE — Addendum Note (Signed)
Addended by: Gershon CraneFRY, Stormy Connon A on: 12/23/2016 09:42 AM   Modules accepted: Orders

## 2016-12-24 NOTE — Telephone Encounter (Signed)
I spoke with pt and went over below information. 

## 2017-05-31 ENCOUNTER — Ambulatory Visit (INDEPENDENT_AMBULATORY_CARE_PROVIDER_SITE_OTHER): Payer: 59 | Admitting: Family Medicine

## 2017-05-31 ENCOUNTER — Encounter: Payer: Self-pay | Admitting: Family Medicine

## 2017-05-31 VITALS — BP 150/98 | HR 78 | Temp 98.6°F | Ht 69.0 in | Wt 192.0 lb

## 2017-05-31 DIAGNOSIS — G5602 Carpal tunnel syndrome, left upper limb: Secondary | ICD-10-CM

## 2017-05-31 DIAGNOSIS — R252 Cramp and spasm: Secondary | ICD-10-CM

## 2017-05-31 LAB — HEPATIC FUNCTION PANEL
ALT: 17 U/L (ref 0–53)
AST: 19 U/L (ref 0–37)
Albumin: 4.3 g/dL (ref 3.5–5.2)
Alkaline Phosphatase: 45 U/L (ref 39–117)
BILIRUBIN DIRECT: 0.1 mg/dL (ref 0.0–0.3)
BILIRUBIN TOTAL: 0.3 mg/dL (ref 0.2–1.2)
Total Protein: 7.2 g/dL (ref 6.0–8.3)

## 2017-05-31 LAB — CBC WITH DIFFERENTIAL/PLATELET
BASOS PCT: 0.8 % (ref 0.0–3.0)
Basophils Absolute: 0.1 10*3/uL (ref 0.0–0.1)
EOS ABS: 0.2 10*3/uL (ref 0.0–0.7)
EOS PCT: 2.3 % (ref 0.0–5.0)
HEMATOCRIT: 41.6 % (ref 39.0–52.0)
HEMOGLOBIN: 13.5 g/dL (ref 13.0–17.0)
LYMPHS PCT: 39.8 % (ref 12.0–46.0)
Lymphs Abs: 3.1 10*3/uL (ref 0.7–4.0)
MCHC: 32.4 g/dL (ref 30.0–36.0)
MCV: 81.6 fl (ref 78.0–100.0)
MONOS PCT: 9.2 % (ref 3.0–12.0)
Monocytes Absolute: 0.7 10*3/uL (ref 0.1–1.0)
NEUTROS ABS: 3.7 10*3/uL (ref 1.4–7.7)
Neutrophils Relative %: 47.9 % (ref 43.0–77.0)
PLATELETS: 257 10*3/uL (ref 150.0–400.0)
RBC: 5.09 Mil/uL (ref 4.22–5.81)
RDW: 14 % (ref 11.5–15.5)
WBC: 7.7 10*3/uL (ref 4.0–10.5)

## 2017-05-31 LAB — BASIC METABOLIC PANEL
BUN: 9 mg/dL (ref 6–23)
CALCIUM: 9.5 mg/dL (ref 8.4–10.5)
CO2: 31 meq/L (ref 19–32)
CREATININE: 1.16 mg/dL (ref 0.40–1.50)
Chloride: 103 mEq/L (ref 96–112)
GFR: 85.17 mL/min (ref >60.00)
Glucose, Bld: 82 mg/dL (ref 70–99)
Potassium: 4.2 mEq/L (ref 3.5–5.1)
SODIUM: 139 meq/L (ref 135–145)

## 2017-05-31 LAB — TSH: TSH: 0.95 u[IU]/mL (ref 0.35–4.50)

## 2017-05-31 LAB — MAGNESIUM: Magnesium: 2.1 mg/dL (ref 1.5–2.5)

## 2017-05-31 NOTE — Patient Instructions (Signed)
WE NOW OFFER   [facility] FAST TRACK!!!  SAME DAY Appointments for ACUTE CARE  Such as: Sprains, Injuries, cuts, abrasions, rashes, muscle pain, joint pain, back pain Colds, flu, sore throats, headache, allergies, cough, fever  Ear pain, sinus and eye infections Abdominal pain, nausea, vomiting, diarrhea, upset stomach Animal/insect bites  3 Easy Ways to Schedule: Walk-In Scheduling Call in scheduling Mychart Sign-up: [email or web address]         

## 2017-05-31 NOTE — Progress Notes (Signed)
   Subjective:    Patient ID: William Novak, male    DOB: 10-09-1966, 51 y.o.   MRN: [id number]  HPI Here for several issues. First he has had some intermittent problems in the left forearm and hand including tightness, numbness, tingling, and pain. No visible swelling. This started about a year ago but lately it has gotten worse. Also for about 2 months he has had cramping of both feet and both hands. Certain things make this worse like gripping a steering wheel while driving.    Review of Systems  Respiratory: Negative.   Cardiovascular: Negative.   Musculoskeletal: Positive for myalgias.  Neurological: Positive for weakness and numbness.       Objective:   Physical Exam  Constitutional: He is oriented to person, place, and time. He appears well-developed and well-nourished.  Neck: No thyromegaly present.  Cardiovascular: Normal rate, regular rhythm, normal heart sounds and intact distal pulses.   Pulmonary/Chest: Effort normal and breath sounds normal. No respiratory distress. He has no wheezes. He has no rales.  Musculoskeletal: Normal range of motion. He exhibits no edema or tenderness.  Lymphadenopathy:    He has no cervical adenopathy.  Neurological: He is alert and oriented to person, place, and time.          Assessment & Plan:  He has frequent muscle cramps so we will get labs today to look for electrolyte disturbances, etc. He will start taking OTC magnesium tablets at 400-800 mg daily and will drink plenty of water. He probably also has carpal tunnel syndrome in the left forearm. Set up a nerve conduction study soon. He can wear a wrist splint as much as possible. Gershon CraneStephen Blaire Hodsdon, MD

## 2017-06-21 ENCOUNTER — Encounter: Payer: Self-pay | Admitting: Neurology

## 2017-06-21 ENCOUNTER — Other Ambulatory Visit: Payer: Self-pay | Admitting: *Deleted

## 2017-06-21 DIAGNOSIS — G5602 Carpal tunnel syndrome, left upper limb: Secondary | ICD-10-CM

## 2017-07-12 ENCOUNTER — Ambulatory Visit (INDEPENDENT_AMBULATORY_CARE_PROVIDER_SITE_OTHER): Payer: 59 | Admitting: Neurology

## 2017-07-12 DIAGNOSIS — G5602 Carpal tunnel syndrome, left upper limb: Secondary | ICD-10-CM

## 2017-07-12 NOTE — Procedures (Signed)
[facility]  [address] [place], [address]  [place], [place] [address] Tel: [phone] Fax:  [phone] Test Date:  07/12/2017  Patient: William Novak DOB: Feb 09, 1966 Physician: Nita Sickle, DO  Sex: Male Height: 5\' 9"  Ref Phys: Gershon Crane, MD  ID#: [id number] Temp: 37.4 Technician:    Patient Complaints: This is a 51 year old man referred for evaluation of the left forearm and hand tingling and pain.  NCV & EMG Findings: Extensive electrodiagnostic testing of the left upper extremity shows:  1. Left median and ulnar sensory responses are within normal limits. Left median mixed palmer responses show prolonged latency. 2. Left median and ulnar motor responses are within normal limits. 3. There is no evidence of active or chronic motor axon loss changes affecting any of the tested muscles. Motor unit configuration and recruitment pattern is within normal limits.  Impression: Left median neuropathy at or distal to the wrist, consistent with the clinical diagnosis of carpal tunnel syndrome. Overall, these findings are mild in degree electrically.   ___________________________ Nita Sickle, DO    Nerve Conduction Studies Anti Sensory Summary Table   Site NR Peak (ms) Norm Peak (ms) P-T Amp (V) Norm P-T Amp  Left Median Anti Sensory (2nd Digit)  Wrist    3.5 <3.6 16.8 >15  Left Ulnar Anti Sensory (5th Digit)  Wrist    2.6 <3.1 14.7 >10   Motor Summary Table   Site NR Onset (ms) Norm Onset (ms) O-P Amp (mV) Norm O-P Amp Site1 Site2 Delta-0 (ms) Dist (cm) Vel (m/s) Norm Vel (m/s)  Left Median Motor (Abd Poll Brev)  Wrist    3.7 <4.0 12.4 >6 Elbow Wrist 5.1 29.5 58 >50  Elbow    8.8  11.2         Left Ulnar Motor (Abd Dig Minimi)  Wrist    2.3 <3.1 9.8 >7 B Elbow Wrist 4.1 25.0 61 >50  B Elbow    6.4  9.2  A Elbow B Elbow 1.7 10.0 59 >50  A Elbow    8.1  8.6          Comparison Summary Table   Site NR Peak (ms) Norm Peak (ms) P-T Amp (V) Site1 Site2 Delta-P  (ms) Norm Delta (ms)  Left Median/Ulnar Palm Comparison (Wrist - 8cm)  Median Palm    2.2 <2.2 27.6 Median Palm Ulnar Palm 0.6   Ulnar Palm    1.6 <2.2 12.7       EMG   Side Muscle Ins Act Fibs Psw Fasc Number Recrt Dur Dur. Amp Amp. Poly Poly. Comment  Left 1stDorInt Nml Nml Nml Nml Nml Nml Nml Nml Nml Nml Nml Nml N/A  Left Abd Poll Brev Nml Nml Nml Nml Nml Nml Nml Nml Nml Nml Nml Nml N/A  Left Ext Indicis Nml Nml Nml Nml Nml Nml Nml Nml Nml Nml Nml Nml N/A  Left PronatorTeres Nml Nml Nml Nml Nml Nml Nml Nml Nml Nml Nml Nml N/A  Left Biceps Nml Nml Nml Nml Nml Nml Nml Nml Nml Nml Nml Nml N/A  Left Triceps Nml Nml Nml Nml Nml Nml Nml Nml Nml Nml Nml Nml N/A  Left Deltoid Nml Nml Nml Nml Nml Nml Nml Nml Nml Nml Nml Nml N/A      Waveforms:

## 2018-02-15 ENCOUNTER — Other Ambulatory Visit: Payer: Self-pay | Admitting: Family Medicine

## 2018-02-15 NOTE — Telephone Encounter (Signed)
Last OV 05/31/2017   Last refilled 08/03/2016 disp 90 with 5 refills   Sent to PCP for approval

## 2018-04-24 ENCOUNTER — Ambulatory Visit (INDEPENDENT_AMBULATORY_CARE_PROVIDER_SITE_OTHER): Payer: 59 | Admitting: Family Medicine

## 2018-04-24 ENCOUNTER — Encounter: Payer: Self-pay | Admitting: Family Medicine

## 2018-04-24 VITALS — BP 128/90 | HR 84 | Temp 98.4°F | Ht 69.25 in | Wt 192.6 lb

## 2018-04-24 DIAGNOSIS — Z0000 Encounter for general adult medical examination without abnormal findings: Secondary | ICD-10-CM | POA: Diagnosis not present

## 2018-04-24 DIAGNOSIS — M542 Cervicalgia: Secondary | ICD-10-CM | POA: Diagnosis not present

## 2018-04-24 LAB — POC URINALSYSI DIPSTICK (AUTOMATED)
Bilirubin, UA: NEGATIVE
Blood, UA: NEGATIVE
GLUCOSE UA: NEGATIVE
Ketones, UA: NEGATIVE
Leukocytes, UA: NEGATIVE
Nitrite, UA: NEGATIVE
PROTEIN UA: NEGATIVE
UROBILINOGEN UA: 0.2 U/dL
pH, UA: 6 (ref 5.0–8.0)

## 2018-04-24 LAB — LIPID PANEL
CHOLESTEROL: 165 mg/dL (ref 0–200)
HDL: 57.7 mg/dL (ref >39.00)
LDL CALC: 96 mg/dL (ref 0–99)
NONHDL: 107.77
Total CHOL/HDL Ratio: 3
Triglycerides: 60 mg/dL (ref 0.0–149.0)
VLDL: 12 mg/dL (ref 0.0–40.0)

## 2018-04-24 LAB — HEPATIC FUNCTION PANEL
ALK PHOS: 44 U/L (ref 39–117)
ALT: 13 U/L (ref 0–53)
AST: 13 U/L (ref 0–37)
Albumin: 4 g/dL (ref 3.5–5.2)
BILIRUBIN DIRECT: 0.1 mg/dL (ref 0.0–0.3)
BILIRUBIN TOTAL: 0.3 mg/dL (ref 0.2–1.2)
Total Protein: 7 g/dL (ref 6.0–8.3)

## 2018-04-24 LAB — BASIC METABOLIC PANEL
BUN: 10 mg/dL (ref 6–23)
CALCIUM: 9.4 mg/dL (ref 8.4–10.5)
CO2: 31 mEq/L (ref 19–32)
Chloride: 103 mEq/L (ref 96–112)
Creatinine, Ser: 1.22 mg/dL (ref 0.40–1.50)
GFR: 80.07 mL/min (ref >60.00)
GLUCOSE: 105 mg/dL — AB (ref 70–99)
POTASSIUM: 4.5 meq/L (ref 3.5–5.1)
SODIUM: 140 meq/L (ref 135–145)

## 2018-04-24 LAB — CBC WITH DIFFERENTIAL/PLATELET
BASOS PCT: 0.9 % (ref 0.0–3.0)
Basophils Absolute: 0.1 10*3/uL (ref 0.0–0.1)
EOS PCT: 4.6 % (ref 0.0–5.0)
Eosinophils Absolute: 0.3 10*3/uL (ref 0.0–0.7)
HCT: 40.8 % (ref 39.0–52.0)
HEMOGLOBIN: 13.2 g/dL (ref 13.0–17.0)
Lymphocytes Relative: 38.4 % (ref 12.0–46.0)
Lymphs Abs: 2.7 10*3/uL (ref 0.7–4.0)
MCHC: 32.4 g/dL (ref 30.0–36.0)
MCV: 81.9 fl (ref 78.0–100.0)
MONO ABS: 0.6 10*3/uL (ref 0.1–1.0)
Monocytes Relative: 8.7 % (ref 3.0–12.0)
Neutro Abs: 3.3 10*3/uL (ref 1.4–7.7)
Neutrophils Relative %: 47.4 % (ref 43.0–77.0)
Platelets: 243 10*3/uL (ref 150.0–400.0)
RBC: 4.98 Mil/uL (ref 4.22–5.81)
RDW: 13.9 % (ref 11.5–15.5)
WBC: 7 10*3/uL (ref 4.0–10.5)

## 2018-04-24 LAB — TSH: TSH: 1.22 u[IU]/mL (ref 0.35–4.50)

## 2018-04-24 LAB — PSA: PSA: 0.68 ng/mL (ref 0.10–4.00)

## 2018-04-24 NOTE — Progress Notes (Signed)
   Subjective:    Patient ID: William Novak, male    DOB: [birth year], 52 y.o.   MRN: [id number]  HPI Here for a well exam. His only complaint is stiffness in the neck. His cervical spine surgery was 6 years ago, and he wants to see Dr. Shon BatonBrooks again. Otherwise he feels good.    Review of Systems  Constitutional: Negative.   HENT: Negative.   Eyes: Negative.   Respiratory: Negative.   Cardiovascular: Negative.   Gastrointestinal: Negative.   Genitourinary: Negative.   Musculoskeletal: Positive for neck pain and neck stiffness.  Skin: Negative.   Neurological: Negative.   Psychiatric/Behavioral: Negative.        Objective:   Physical Exam  Constitutional: He is oriented to person, place, and time. He appears well-developed and well-nourished. No distress.  HENT:  Head: Normocephalic and atraumatic.  Right Ear: External ear normal.  Left Ear: External ear normal.  Nose: Nose normal.  Mouth/Throat: Oropharynx is clear and moist. No oropharyngeal exudate.  Eyes: Pupils are equal, round, and reactive to light. Conjunctivae and EOM are normal. Right eye exhibits no discharge. Left eye exhibits no discharge. No scleral icterus.  Neck: Neck supple. No JVD present. No tracheal deviation present. No thyromegaly present.  Cardiovascular: Normal rate, regular rhythm, normal heart sounds and intact distal pulses. Exam reveals no gallop and no friction rub.  No murmur heard. Pulmonary/Chest: Effort normal and breath sounds normal. No respiratory distress. He has no wheezes. He has no rales. He exhibits no tenderness.  Abdominal: Soft. Bowel sounds are normal. He exhibits no distension and no mass. There is no tenderness. There is no rebound and no guarding.  Genitourinary: Rectum normal, prostate normal and penis normal. Rectal exam shows guaiac negative stool. No penile tenderness.  Musculoskeletal: Normal range of motion. He exhibits no edema or tenderness.  Lymphadenopathy:    He has  no cervical adenopathy.  Neurological: He is alert and oriented to person, place, and time. He has normal reflexes. He displays normal reflexes. No cranial nerve deficit. He exhibits normal muscle tone. Coordination normal.  Skin: Skin is warm and dry. No rash noted. He is not diaphoretic. No erythema. No pallor.  Psychiatric: He has a normal mood and affect. His behavior is normal. Judgment and thought content normal.          Assessment & Plan:  Well exam. We discussed diet and exercise. Get fasting labs. Set up a colonoscopy. Refer back to Dr. Shon BatonBrooks.  Gershon CraneStephen Traeger Sultana, MD

## 2018-05-29 ENCOUNTER — Encounter: Payer: Self-pay | Admitting: Family Medicine

## 2018-12-21 ENCOUNTER — Ambulatory Visit: Payer: 59 | Admitting: Family Medicine

## 2018-12-21 ENCOUNTER — Encounter: Payer: Self-pay | Admitting: Family Medicine

## 2018-12-21 VITALS — BP 128/90 | HR 85 | Temp 99.2°F | Wt 186.5 lb

## 2018-12-21 DIAGNOSIS — J101 Influenza due to other identified influenza virus with other respiratory manifestations: Secondary | ICD-10-CM

## 2018-12-21 DIAGNOSIS — R509 Fever, unspecified: Secondary | ICD-10-CM | POA: Diagnosis not present

## 2018-12-21 LAB — POCT INFLUENZA A/B
Influenza A, POC: POSITIVE — AB
Influenza B, POC: NEGATIVE

## 2018-12-21 MED ORDER — OSELTAMIVIR PHOSPHATE 75 MG PO CAPS: 75.0000 mg | ORAL_CAPSULE | Freq: Two times a day (BID) | ORAL | 0 refills | Status: DC

## 2018-12-21 NOTE — Progress Notes (Signed)
   Subjective:    Patient ID: William Novak, male    DOB: May 03, 1966, 53 y.o.   MRN: [id number]  HPI Here for 3 days of fever, body aches, a dry cough, and head congestion. No NVD. His wife tested positive for influenza A here a few days ago.    Review of Systems  Constitutional: Positive for fever.  HENT: Positive for congestion and postnasal drip. Negative for sore throat.   Eyes: Negative.   Respiratory: Positive for cough.   Gastrointestinal: Negative.        Objective:   Physical Exam Constitutional:      Appearance: Normal appearance. He is not ill-appearing.  HENT:     Right Ear: Tympanic membrane and ear canal normal.     Left Ear: Tympanic membrane and ear canal normal.     Nose: Nose normal.     Mouth/Throat:     Pharynx: Oropharynx is clear.  Eyes:     Conjunctiva/sclera: Conjunctivae normal.  Pulmonary:     Effort: Pulmonary effort is normal. No respiratory distress.     Breath sounds: Normal breath sounds. No stridor. No wheezing, rhonchi or rales.  Lymphadenopathy:     Cervical: No cervical adenopathy.  Neurological:     Mental Status: He is alert.           Assessment & Plan:  Influenza A, treat with Tamiflu. Use Delsum prn. Written out of work from 12-19-18 to 12-23-18.  Gershon Crane, MD

## 2019-03-17 ENCOUNTER — Other Ambulatory Visit: Payer: Self-pay | Admitting: Family Medicine

## 2019-03-20 NOTE — Telephone Encounter (Signed)
He needs refills on Flexeril. These were sent in.

## 2020-06-12 ENCOUNTER — Encounter: Payer: 59 | Admitting: Family Medicine

## 2020-06-13 ENCOUNTER — Encounter: Payer: 59 | Admitting: Family Medicine

## 2020-06-17 ENCOUNTER — Other Ambulatory Visit: Payer: Self-pay

## 2020-06-17 ENCOUNTER — Other Ambulatory Visit: Payer: Self-pay | Admitting: Family Medicine

## 2020-06-17 ENCOUNTER — Encounter: Payer: Self-pay | Admitting: Family Medicine

## 2020-06-17 ENCOUNTER — Ambulatory Visit (INDEPENDENT_AMBULATORY_CARE_PROVIDER_SITE_OTHER): Payer: 59 | Admitting: Family Medicine

## 2020-06-17 VITALS — BP 130/72 | HR 81 | Temp 97.6°F | Ht 69.0 in | Wt 198.4 lb

## 2020-06-17 DIAGNOSIS — Z0000 Encounter for general adult medical examination without abnormal findings: Secondary | ICD-10-CM | POA: Diagnosis not present

## 2020-06-17 DIAGNOSIS — R739 Hyperglycemia, unspecified: Secondary | ICD-10-CM

## 2020-06-17 MED ORDER — CYCLOBENZAPRINE HCL 10 MG PO TABS: ORAL_TABLET | ORAL | 5 refills | Status: DC

## 2020-06-17 NOTE — Progress Notes (Signed)
   Subjective:    Patient ID: William Novak, male    DOB: May 09, 1966, 54 y.o.   MRN: [id number]  HPI Here for a well exam. He feels fine.    Review of Systems  Constitutional: Negative.   HENT: Negative.   Eyes: Negative.   Respiratory: Negative.   Cardiovascular: Negative.   Gastrointestinal: Negative.   Genitourinary: Negative.   Musculoskeletal: Negative.   Skin: Negative.   Neurological: Negative.   Psychiatric/Behavioral: Negative.        Objective:   Physical Exam Constitutional:      General: He is not in acute distress.    Appearance: He is well-developed. He is not diaphoretic.  HENT:     Head: Normocephalic and atraumatic.     Right Ear: External ear normal.     Left Ear: External ear normal.     Nose: Nose normal.     Mouth/Throat:     Pharynx: No oropharyngeal exudate.  Eyes:     General: No scleral icterus.       Right eye: No discharge.        Left eye: No discharge.     Conjunctiva/sclera: Conjunctivae normal.     Pupils: Pupils are equal, round, and reactive to light.  Neck:     Thyroid: No thyromegaly.     Vascular: No JVD.     Trachea: No tracheal deviation.  Cardiovascular:     Rate and Rhythm: Normal rate and regular rhythm.     Heart sounds: Normal heart sounds. No murmur heard.  No friction rub. No gallop.   Pulmonary:     Effort: Pulmonary effort is normal. No respiratory distress.     Breath sounds: Normal breath sounds. No wheezing or rales.  Chest:     Chest wall: No tenderness.  Abdominal:     General: Bowel sounds are normal. There is no distension.     Palpations: Abdomen is soft. There is no mass.     Tenderness: There is no abdominal tenderness. There is no guarding or rebound.  Genitourinary:    Penis: Normal. No tenderness.      Testes: Normal.     Prostate: Normal.     Rectum: Normal. Guaiac result negative.  Musculoskeletal:        General: No tenderness. Normal range of motion.     Cervical back: Neck supple.    Lymphadenopathy:     Cervical: No cervical adenopathy.  Skin:    General: Skin is warm and dry.     Coloration: Skin is not pale.     Findings: No erythema or rash.  Neurological:     Mental Status: He is alert and oriented to person, place, and time.     Cranial Nerves: No cranial nerve deficit.     Motor: No abnormal muscle tone.     Coordination: Coordination normal.     Deep Tendon Reflexes: Reflexes are normal and symmetric. Reflexes normal.  Psychiatric:        Behavior: Behavior normal.        Thought Content: Thought content normal.        Judgment: Judgment normal.           Assessment & Plan:  Well exam. We discussed diet and exercise. Get fasting labs. Set up his first colonoscopy.  Gershon Crane, MD

## 2020-06-18 LAB — CBC WITH DIFFERENTIAL/PLATELET
Absolute Monocytes: 470 cells/uL (ref 200–950)
Basophils Absolute: 58 cells/uL (ref 0–200)
Basophils Relative: 1 %
Eosinophils Absolute: 151 cells/uL (ref 15–500)
Eosinophils Relative: 2.6 %
HCT: 41.3 % (ref 38.5–50.0)
Hemoglobin: 13.3 g/dL (ref 13.2–17.1)
Lymphs Abs: 2378 cells/uL (ref 850–3900)
MCH: 26.2 pg — ABNORMAL LOW (ref 27.0–33.0)
MCHC: 32.2 g/dL (ref 32.0–36.0)
MCV: 81.5 fL (ref 80.0–100.0)
MPV: 11.2 fL (ref 7.5–12.5)
Monocytes Relative: 8.1 %
Neutro Abs: 2743 cells/uL (ref 1500–7800)
Neutrophils Relative %: 47.3 %
Platelets: 264 10*3/uL (ref 140–400)
RBC: 5.07 10*6/uL (ref 4.20–5.80)
RDW: 12.5 % (ref 11.0–15.0)
Total Lymphocyte: 41 %
WBC: 5.8 10*3/uL (ref 3.8–10.8)

## 2020-06-18 LAB — LIPID PANEL
Cholesterol: 188 mg/dL (ref <200)
HDL: 74 mg/dL (ref >=40)
LDL Cholesterol (Calc): 97 mg/dL (calc)
Non-HDL Cholesterol (Calc): 114 mg/dL (calc) (ref <130)
Total CHOL/HDL Ratio: 2.5 (calc) (ref <5.0)
Triglycerides: 84 mg/dL (ref <150)

## 2020-06-18 LAB — HEPATIC FUNCTION PANEL
AG Ratio: 1.4 (calc) (ref 1.0–2.5)
ALT: 22 U/L (ref 9–46)
AST: 17 U/L (ref 10–35)
Albumin: 4.3 g/dL (ref 3.6–5.1)
Alkaline phosphatase (APISO): 44 U/L (ref 35–144)
Bilirubin, Direct: 0.1 mg/dL (ref 0.0–0.2)
Globulin: 3 g/dL (calc) (ref 1.9–3.7)
Indirect Bilirubin: 0.5 mg/dL (calc) (ref 0.2–1.2)
Total Bilirubin: 0.6 mg/dL (ref 0.2–1.2)
Total Protein: 7.3 g/dL (ref 6.1–8.1)

## 2020-06-18 LAB — BASIC METABOLIC PANEL
BUN: 8 mg/dL (ref 7–25)
CO2: 30 mmol/L (ref 20–32)
Calcium: 9.6 mg/dL (ref 8.6–10.3)
Chloride: 103 mmol/L (ref 98–110)
Creat: 1.15 mg/dL (ref 0.70–1.33)
Glucose, Bld: 97 mg/dL (ref 65–99)
Potassium: 4.2 mmol/L (ref 3.5–5.3)
Sodium: 138 mmol/L (ref 135–146)

## 2020-06-18 LAB — HEMOGLOBIN A1C
Hgb A1c MFr Bld: 6.4 % of total Hgb — ABNORMAL HIGH (ref <5.7)
Mean Plasma Glucose: 137 (calc)
eAG (mmol/L): 7.6 (calc)

## 2020-06-18 LAB — PSA: PSA: 0.6 ng/mL (ref <=4.0)

## 2020-06-18 LAB — TSH: TSH: 1.2 mIU/L (ref 0.40–4.50)

## 2020-06-19 ENCOUNTER — Telehealth: Payer: Self-pay | Admitting: Family Medicine

## 2020-06-19 NOTE — Telephone Encounter (Signed)
The patient called wanting his lab results. °

## 2020-06-19 NOTE — Telephone Encounter (Signed)
Discussed results with patient, patient expressed understanding. Nothing further needed. ° °

## 2020-08-05 ENCOUNTER — Telehealth: Payer: Self-pay | Admitting: Family Medicine

## 2020-08-05 NOTE — Telephone Encounter (Signed)
Called patient. Left a message that he does need to keep appointment

## 2020-08-05 NOTE — Telephone Encounter (Signed)
Pt had four days of itching on the right arm and knots on the arm pt has an appointment to see Dr. Caryl Never on 08/06/2020. would you please advise if he should keep his appt  [phone]

## 2020-08-06 ENCOUNTER — Other Ambulatory Visit: Payer: Self-pay

## 2020-08-06 ENCOUNTER — Ambulatory Visit (INDEPENDENT_AMBULATORY_CARE_PROVIDER_SITE_OTHER): Payer: 59 | Admitting: Family Medicine

## 2020-08-06 VITALS — BP 140/80 | HR 85 | Temp 98.1°F | Wt 196.8 lb

## 2020-08-06 DIAGNOSIS — R21 Rash and other nonspecific skin eruption: Secondary | ICD-10-CM | POA: Diagnosis not present

## 2020-08-06 MED ORDER — TRIAMCINOLONE ACETONIDE 0.1 % EX CREA: 1.0000 "application " | TOPICAL_CREAM | Freq: Two times a day (BID) | CUTANEOUS | 0 refills | Status: AC

## 2020-08-06 NOTE — Patient Instructions (Signed)
Continue the Benadryl at night  May add OTC Zyrtec or Allegra  May also add OTC Pepcid 20 mg twice daily.

## 2020-08-06 NOTE — Progress Notes (Signed)
Established Patient Office Visit  Subjective:  Patient ID: William Novak, male    DOB: 20-Nov-1965  Age: 54 y.o. MRN: [id number]  CC: No chief complaint on file.   HPI William Novak presents for pruritic bumps on his right forearm and arm.  These were first noted 3 to 4 days ago.  Denies any other areas of rash.  These are moderately pruritic.  He does have history of allergy tendencies in general and has had allergy testing in the past.  Has been taking Benadryl 25 mg at night with minimal improvement.  No change in soaps or detergent.  No clear provoking factors.    Past Medical History:  Diagnosis Date  . Angioedema   . Arthritis    Generalized  . Concussion   . Hypertension   . Hypoglycemia   . Low back pain     Past Surgical History:  Procedure Laterality Date  . ANTERIOR CERVICAL DECOMP/DISCECTOMY FUSION  08/02/2012   Procedure: ANTERIOR CERVICAL DECOMPRESSION/DISCECTOMY FUSION 1 LEVEL;  Surgeon: Venita Lick, MD;  Location: [facility] OR;  Service: Orthopedics;  Laterality: N/A;  Cervical Total Disc Replacement Verses ACDF C5-C6   . fistula removal    . hemmorhoidectomy      Family History  Problem Relation Age of Onset  . Diabetes Other   . Cancer Other        breast  . Hypertension Other   . Diabetes Mother   . Cancer Mother   . Heart attack Father     Social History   Socioeconomic History  . Marital status: Married    Spouse name: Not on file  . Number of children: Not on file  . Years of education: Not on file  . Highest education level: Not on file  Occupational History  . Not on file  Tobacco Use  . Smoking status: Never Smoker  . Smokeless tobacco: Never Used  Substance and Sexual Activity  . Alcohol use: No    Alcohol/week: 0.0 standard drinks  . Drug use: No  . Sexual activity: Not on file  Other Topics Concern  . Not on file  Social History Narrative  . Not on file   Social Determinants of Health   Financial Resource Strain:   .  Difficulty of Paying Living Expenses: Not on file  Food Insecurity:   . Worried About Programme researcher, broadcasting/film/video in the Last Year: Not on file  . Ran Out of Food in the Last Year: Not on file  Transportation Needs:   . Lack of Transportation (Medical): Not on file  . Lack of Transportation (Non-Medical): Not on file  Physical Activity:   . Days of Exercise per Week: Not on file  . Minutes of Exercise per Session: Not on file  Stress:   . Feeling of Stress : Not on file  Social Connections:   . Frequency of Communication with Friends and Family: Not on file  . Frequency of Social Gatherings with Friends and Family: Not on file  . Attends Religious Services: Not on file  . Active Member of Clubs or Organizations: Not on file  . Attends Banker Meetings: Not on file  . Marital Status: Not on file  Intimate Partner Violence:   . Fear of Current or Ex-Partner: Not on file  . Emotionally Abused: Not on file  . Physically Abused: Not on file  . Sexually Abused: Not on file    Outpatient Medications Prior to Visit  Medication Sig Dispense Refill  . cyclobenzaprine (FLEXERIL) 10 MG tablet TAKE 1 TABLET BY MOUTH THREE TIMES DAILY AS NEEDED DPR MUSCULE SPASMS 90 tablet 5  . diphenhydrAMINE (BENADRYL) 25 mg capsule Take 25 mg by mouth every 6 (six) hours as needed. For allergies    . fluticasone (FLONASE) 50 MCG/ACT nasal spray Place into both nostrils daily.     No facility-administered medications prior to visit.    No Known Allergies  ROS Review of Systems  Constitutional: Negative for chills and fever.  Respiratory: Negative for shortness of breath.   Skin: Positive for rash.      Objective:    Physical Exam Vitals reviewed.  Constitutional:      Appearance: Normal appearance.  Cardiovascular:     Rate and Rhythm: Normal rate and regular rhythm.  Pulmonary:     Effort: Pulmonary effort is normal.     Breath sounds: Normal breath sounds.  Skin:    Comments: He  has a couple of slightly raised palpable urticarial type lesions right forearm and right arm.  No pustules.  No vesicles.  Nontender.  Neurological:     Mental Status: He is alert.     BP 140/80 (BP Location: Right Arm, Patient Position: Sitting, Cuff Size: Normal)   Pulse 85   Temp 98.1 F (36.7 C) (Oral)   Wt 196 lb 12.8 oz (89.3 kg)   SpO2 97%   BMI 29.06 kg/m  Wt Readings from Last 3 Encounters:  08/06/20 196 lb 12.8 oz (89.3 kg)  06/17/20 198 lb 6.4 oz (90 kg)  12/21/18 186 lb 8 oz (84.6 kg)     Health Maintenance Due  Topic Date Due  . Hepatitis C Screening  Never done  . COVID-19 Vaccine (1) Never done  . HIV Screening  Never done  . TETANUS/TDAP  Never done  . COLONOSCOPY  Never done  . INFLUENZA VACCINE  Never done    There are no preventive care reminders to display for this patient.  Lab Results  Component Value Date   TSH 1.20 06/17/2020   Lab Results  Component Value Date   WBC 5.8 06/17/2020   HGB 13.3 06/17/2020   HCT 41.3 06/17/2020   MCV 81.5 06/17/2020   PLT 264 06/17/2020   Lab Results  Component Value Date   NA 138 06/17/2020   K 4.2 06/17/2020   CO2 30 06/17/2020   GLUCOSE 97 06/17/2020   BUN 8 06/17/2020   CREATININE 1.15 06/17/2020   BILITOT 0.6 06/17/2020   ALKPHOS 44 04/24/2018   AST 17 06/17/2020   ALT 22 06/17/2020   PROT 7.3 06/17/2020   ALBUMIN 4.0 04/24/2018   CALCIUM 9.6 06/17/2020   GFR 80.07 04/24/2018   Lab Results  Component Value Date   CHOL 188 06/17/2020   Lab Results  Component Value Date   HDL 74 06/17/2020   Lab Results  Component Value Date   LDLCALC 97 06/17/2020   Lab Results  Component Value Date   TRIG 84 06/17/2020   Lab Results  Component Value Date   CHOLHDL 2.5 06/17/2020   Lab Results  Component Value Date   HGBA1C 6.4 (H) 06/17/2020      Assessment & Plan:   He has a few scattered slightly raised urticarial type lesions right upper extremity.  Etiology unclear  -Continue  Benadryl at night -Consider Zyrtec or Allegra for daytime use and also may consider H2 blocker such as Pepcid -Triamcinolone 0.1% cream once or  twice daily as needed  Meds ordered this encounter  Medications  . triamcinolone cream (KENALOG) 0.1 %    Sig: Apply 1 application topically 2 (two) times daily.    Dispense:  15 g    Refill:  0    Follow-up: No follow-ups on file.    Evelena Peat, MD

## 2020-08-18 ENCOUNTER — Encounter: Payer: Self-pay | Admitting: Gastroenterology

## 2020-09-26 ENCOUNTER — Other Ambulatory Visit: Payer: Self-pay

## 2020-09-26 ENCOUNTER — Ambulatory Visit (AMBULATORY_SURGERY_CENTER): Payer: Self-pay | Admitting: *Deleted

## 2020-09-26 ENCOUNTER — Encounter: Payer: Self-pay | Admitting: Gastroenterology

## 2020-09-26 VITALS — Ht 69.0 in | Wt 195.0 lb

## 2020-09-26 DIAGNOSIS — Z1211 Encounter for screening for malignant neoplasm of colon: Secondary | ICD-10-CM

## 2020-09-26 MED ORDER — SUTAB 1479-225-188 MG PO TABS: 24.0000 | ORAL_TABLET | ORAL | 0 refills | Status: DC

## 2020-09-26 NOTE — Progress Notes (Signed)

## 2020-10-13 ENCOUNTER — Ambulatory Visit (AMBULATORY_SURGERY_CENTER): Payer: 59 | Admitting: Gastroenterology

## 2020-10-13 ENCOUNTER — Other Ambulatory Visit: Payer: Self-pay

## 2020-10-13 ENCOUNTER — Encounter: Payer: Self-pay | Admitting: Gastroenterology

## 2020-10-13 VITALS — BP 148/104 | HR 79 | Temp 98.4°F | Resp 14 | Ht 69.0 in | Wt 195.0 lb

## 2020-10-13 DIAGNOSIS — Z1211 Encounter for screening for malignant neoplasm of colon: Secondary | ICD-10-CM

## 2020-10-13 HISTORY — PX: COLONOSCOPY: SHX174

## 2020-10-13 MED ORDER — SODIUM CHLORIDE 0.9 % IV SOLN: 500.0000 mL | Freq: Once | INTRAVENOUS | Status: DC

## 2020-10-13 NOTE — Patient Instructions (Signed)
Impression/Recommendations:  Diverticulosis and hemorrhoid handouts given to patient.  Resume previous diet. Continue present medications. Repeat colonoscopy in 10 years for screening purposes.  YOU HAD AN ENDOSCOPIC PROCEDURE TODAY AT THE [facility]:   Refer to the procedure report that was given to you for any specific questions about what was found during the examination.  If the procedure report does not answer your questions, please call your gastroenterologist to clarify.  If you requested that your care partner not be given the details of your procedure findings, then the procedure report has been included in a sealed envelope for you to review at your convenience later.  YOU SHOULD EXPECT: Some feelings of bloating in the abdomen. Passage of more gas than usual.  Walking can help get rid of the air that was put into your GI tract during the procedure and reduce the bloating. If you had a lower endoscopy (such as a colonoscopy or flexible sigmoidoscopy) you may notice spotting of blood in your stool or on the toilet paper. If you underwent a bowel prep for your procedure, you may not have a normal bowel movement for a few days.  Please Note:  You might notice some irritation and congestion in your nose or some drainage.  This is from the oxygen used during your procedure.  There is no need for concern and it should clear up in a day or so.  SYMPTOMS TO REPORT IMMEDIATELY:   Following lower endoscopy (colonoscopy or flexible sigmoidoscopy):  Excessive amounts of blood in the stool  Significant tenderness or worsening of abdominal pains  Swelling of the abdomen that is new, acute  Fever of 100F or higher  For urgent or emergent issues, a gastroenterologist can be reached at any hour by calling [phone]. Do not use MyChart messaging for urgent concerns.    DIET:  We do recommend a small meal at first, but then you may proceed to your regular diet.  Drink plenty of  fluids but you should avoid alcoholic beverages for 24 hours.  ACTIVITY:  You should plan to take it easy for the rest of today and you should NOT DRIVE or use heavy machinery until tomorrow (because of the sedation medicines used during the test).    FOLLOW UP: Our staff will call the number listed on your records 48-72 hours following your procedure to check on you and address any questions or concerns that you may have regarding the information given to you following your procedure. If we do not reach you, we will leave a message.  We will attempt to reach you two times.  During this call, we will ask if you have developed any symptoms of COVID 19. If you develop any symptoms (ie: fever, flu-like symptoms, shortness of breath, cough etc.) before then, please call [phone].  If you test positive for Covid 19 in the 2 weeks post procedure, please call and report this information to [place].    If any biopsies were taken you will be contacted by phone or by letter within the next 1-3 weeks.  Please call us at [phone] if you have not heard about the biopsies in 3 weeks.    SIGNATURES/CONFIDENTIALITY: You and/or your care partner have signed paperwork which will be entered into your electronic medical record.  These signatures attest to the fact that that the information above on your After Visit Summary has been reviewed and is understood.  Full responsibility of the confidentiality of this discharge information  lies with you and/or your care-partner.

## 2020-10-13 NOTE — Progress Notes (Signed)
Reported elevated BP to CRNA, follow-up with PCP advised.  Pt. Told of elevated BP, and to report for F/U with PCP.

## 2020-10-13 NOTE — Progress Notes (Signed)
Pt.'s last BP readings in RR diastolic was over 100.  Rechecked BP 3 times, repositioned pt., abdomen soft, pt. Denies any pain, discomfort, other than a tooth that he plans to have pulled. Skin warm and Dry, pt. Alert and oriented x 3. Pt. Denies taking any BP medicine.  Told pt. To F/U w/ his PCP re: elevated diastolic BP.  Stopping in restroom on the way out to relieve gas and full bladder.

## 2020-10-13 NOTE — Progress Notes (Signed)
A/ox3, pleased with MAC, report to RN 

## 2020-10-13 NOTE — Progress Notes (Signed)
JB - Check-in  CW - VS  Pt's states no medical or surgical changes since previsit or office visit. 

## 2020-10-13 NOTE — Op Note (Signed)
[facility] Patient Name: William Novak Procedure Date: 10/13/2020 1:33 PM MRN: [id number] Endoscopist: Viviann SpareSteven P. Adela LankArmbruster , MD Age: 54 Referring MD:  Date of Birth: 1966/01/22 Gender: Male Account #: [id number] Procedure:                Colonoscopy Indications:              Screening for colorectal malignant neoplasm, This                            is the patient's first colonoscopy Medicines:                Monitored Anesthesia Care Procedure:                Pre-Anesthesia Assessment:                           - Prior to the procedure, a History and Physical                            was performed, and patient medications and                            allergies were reviewed. The patient's tolerance of                            previous anesthesia was also reviewed. The risks                            and benefits of the procedure and the sedation                            options and risks were discussed with the patient.                            All questions were answered, and informed consent                            was obtained. Prior Anticoagulants: The patient has                            taken no previous anticoagulant or antiplatelet                            agents. ASA Grade Assessment: II - A patient with                            mild systemic disease. After reviewing the risks                            and benefits, the patient was deemed in                            satisfactory condition to undergo the procedure.  After obtaining informed consent, the colonoscope                            was passed under direct vision. Throughout the                            procedure, the patient's blood pressure, pulse, and                            oxygen saturations were monitored continuously. The                            Colonoscope was introduced through the anus and                            advanced to the the  cecum, identified by                            appendiceal orifice and ileocecal valve. The                            colonoscopy was performed without difficulty. The                            patient tolerated the procedure well. The quality                            of the bowel preparation was good. The ileocecal                            valve, appendiceal orifice, and rectum were                            photographed. Scope In: 1:38:58 PM Scope Out: 2:03:45 PM Scope Withdrawal Time: 0 hours 21 minutes 24 seconds  Total Procedure Duration: 0 hours 24 minutes 47 seconds  Findings:                 The perianal and digital rectal examinations were                            normal.                           A single small angiodysplastic lesion was found in                            the transverse colon.                           A few small-mouthed diverticula were found in the                            ascending colon.  Internal hemorrhoids were found during retroflexion.                           The colon was a bit tortous. The exam was otherwise                            without abnormality. Complications:            No immediate complications. Estimated blood loss:                            None. Estimated Blood Loss:     Estimated blood loss: none. Impression:               - A single colonic angiodysplastic lesion.                           - Diverticulosis in the ascending colon.                           - Internal hemorrhoids.                           - Tortous colon                           - The examination was otherwise normal.                           - No polyps Recommendation:           - Patient has a contact number available for                            emergencies. The signs and symptoms of potential                            delayed complications were discussed with the                            patient. Return to  normal activities tomorrow.                            Written discharge instructions were provided to the                            patient.                           - Resume previous diet.                           - Continue present medications.                           - Repeat colonoscopy in 10 years for screening  purposes. Viviann Spare P. Samary Shatz, MD 10/13/2020 2:09:14 PM This report has been signed electronically.

## 2020-10-15 ENCOUNTER — Telehealth: Payer: Self-pay

## 2020-10-15 NOTE — Telephone Encounter (Signed)
Attempted to reach patient for post-procedure f/u call. Family member answered phone, patient is currently not home. Left message with family member that we will call him again later today and for him to please not hesitate to call us if he has any questions/concerns.

## 2020-10-15 NOTE — Telephone Encounter (Signed)
LVM

## 2021-02-03 ENCOUNTER — Other Ambulatory Visit: Payer: Self-pay | Admitting: Family Medicine

## 2021-02-03 ENCOUNTER — Ambulatory Visit: Payer: Self-pay

## 2021-02-03 ENCOUNTER — Other Ambulatory Visit: Payer: Self-pay

## 2021-02-03 DIAGNOSIS — M545 Low back pain, unspecified: Secondary | ICD-10-CM

## 2021-06-09 ENCOUNTER — Other Ambulatory Visit: Payer: Self-pay | Admitting: Orthopedic Surgery

## 2021-06-09 DIAGNOSIS — M545 Low back pain, unspecified: Secondary | ICD-10-CM

## 2021-06-10 ENCOUNTER — Telehealth: Payer: Self-pay

## 2021-06-10 NOTE — Progress Notes (Signed)
Phone call to patient to discuss order for intradiscal injection. Reviewed pts allergies and most recent weight. Pt was advised not to take oral antibiotics for this procedure that we would give him IV antibiotics prior. Discharge instructions also reviewed with patient and instructed patient to have a driver the day of the procedure. Pt verbalized understanding.  

## 2021-06-18 ENCOUNTER — Other Ambulatory Visit: Payer: 59

## 2021-07-06 ENCOUNTER — Other Ambulatory Visit: Payer: Self-pay

## 2021-07-06 ENCOUNTER — Ambulatory Visit (INDEPENDENT_AMBULATORY_CARE_PROVIDER_SITE_OTHER): Payer: 59 | Admitting: Family Medicine

## 2021-07-06 ENCOUNTER — Encounter: Payer: Self-pay | Admitting: Family Medicine

## 2021-07-06 VITALS — BP 120/80 | HR 73 | Temp 97.7°F | Ht 69.0 in | Wt 197.0 lb

## 2021-07-06 DIAGNOSIS — Z0000 Encounter for general adult medical examination without abnormal findings: Secondary | ICD-10-CM

## 2021-07-06 DIAGNOSIS — Z125 Encounter for screening for malignant neoplasm of prostate: Secondary | ICD-10-CM | POA: Diagnosis not present

## 2021-07-06 DIAGNOSIS — Z23 Encounter for immunization: Secondary | ICD-10-CM | POA: Diagnosis not present

## 2021-07-06 LAB — BASIC METABOLIC PANEL
BUN: 9 mg/dL (ref 6–23)
CO2: 29 mEq/L (ref 19–32)
Calcium: 9.7 mg/dL (ref 8.4–10.5)
Chloride: 102 mEq/L (ref 96–112)
Creatinine, Ser: 1.13 mg/dL (ref 0.40–1.50)
GFR: 73.09 mL/min (ref >60.00)
Glucose, Bld: 89 mg/dL (ref 70–99)
Potassium: 4.3 mEq/L (ref 3.5–5.1)
Sodium: 139 mEq/L (ref 135–145)

## 2021-07-06 LAB — T3, FREE: T3, Free: 3.1 pg/mL (ref 2.3–4.2)

## 2021-07-06 LAB — CBC WITH DIFFERENTIAL/PLATELET
Basophils Absolute: 0.1 10*3/uL (ref 0.0–0.1)
Basophils Relative: 0.9 % (ref 0.0–3.0)
Eosinophils Absolute: 0.2 10*3/uL (ref 0.0–0.7)
Eosinophils Relative: 2.7 % (ref 0.0–5.0)
HCT: 40.8 % (ref 39.0–52.0)
Hemoglobin: 13.2 g/dL (ref 13.0–17.0)
Lymphocytes Relative: 37.1 % (ref 12.0–46.0)
Lymphs Abs: 2.9 10*3/uL (ref 0.7–4.0)
MCHC: 32.2 g/dL (ref 30.0–36.0)
MCV: 81.6 fl (ref 78.0–100.0)
Monocytes Absolute: 0.7 10*3/uL (ref 0.1–1.0)
Monocytes Relative: 8.6 % (ref 3.0–12.0)
Neutro Abs: 4 10*3/uL (ref 1.4–7.7)
Neutrophils Relative %: 50.7 % (ref 43.0–77.0)
Platelets: 221 10*3/uL (ref 150.0–400.0)
RBC: 5 Mil/uL (ref 4.22–5.81)
RDW: 14.3 % (ref 11.5–15.5)
WBC: 7.8 10*3/uL (ref 4.0–10.5)

## 2021-07-06 LAB — HEMOGLOBIN A1C: Hgb A1c MFr Bld: 6.5 % (ref 4.6–6.5)

## 2021-07-06 LAB — LIPID PANEL
Cholesterol: 171 mg/dL (ref 0–200)
HDL: 65.6 mg/dL (ref >39.00)
LDL Cholesterol: 92 mg/dL (ref 0–99)
NonHDL: 105.81
Total CHOL/HDL Ratio: 3
Triglycerides: 70 mg/dL (ref 0.0–149.0)
VLDL: 14 mg/dL (ref 0.0–40.0)

## 2021-07-06 LAB — HEPATIC FUNCTION PANEL
ALT: 18 U/L (ref 0–53)
AST: 18 U/L (ref 0–37)
Albumin: 4.3 g/dL (ref 3.5–5.2)
Alkaline Phosphatase: 45 U/L (ref 39–117)
Bilirubin, Direct: 0.1 mg/dL (ref 0.0–0.3)
Total Bilirubin: 0.6 mg/dL (ref 0.2–1.2)
Total Protein: 7.6 g/dL (ref 6.0–8.3)

## 2021-07-06 LAB — T4, FREE: Free T4: 0.59 ng/dL — ABNORMAL LOW (ref 0.60–1.60)

## 2021-07-06 LAB — PSA: PSA: 0.65 ng/mL (ref 0.10–4.00)

## 2021-07-06 LAB — TSH: TSH: 1.18 u[IU]/mL (ref 0.35–5.50)

## 2021-07-06 NOTE — Addendum Note (Signed)
Addended by: Kandra Nicolas on: 07/06/2021 01:46 PM   Modules accepted: Orders

## 2021-07-06 NOTE — Addendum Note (Signed)
Addended by: Carola Rhine on: 07/06/2021 02:33 PM   Modules accepted: Orders
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
## 2021-07-09 ENCOUNTER — Other Ambulatory Visit: Payer: Self-pay

## 2021-07-09 DIAGNOSIS — E039 Hypothyroidism, unspecified: Secondary | ICD-10-CM

## 2021-07-09 MED ORDER — LEVOTHYROXINE SODIUM 75 MCG PO TABS: 75.0000 ug | ORAL_TABLET | Freq: Every day | ORAL | 3 refills | Status: DC

## 2021-07-27 ENCOUNTER — Other Ambulatory Visit: Payer: Self-pay | Admitting: Family Medicine

## 2021-10-06 ENCOUNTER — Telehealth: Payer: Self-pay | Admitting: Family Medicine

## 2021-10-06 NOTE — Telephone Encounter (Signed)
Patient needs refill on Levothyroxine.  He has 4 days left.  Pharmacy- [facility] on [address]

## 2021-10-07 ENCOUNTER — Ambulatory Visit: Payer: 59 | Admitting: Family Medicine

## 2021-10-20 ENCOUNTER — Ambulatory Visit: Payer: 59 | Admitting: Family Medicine

## 2021-10-20 ENCOUNTER — Encounter: Payer: Self-pay | Admitting: Family Medicine

## 2021-10-20 VITALS — BP 124/82 | HR 84 | Temp 98.6°F | Wt 194.1 lb

## 2021-10-20 DIAGNOSIS — E039 Hypothyroidism, unspecified: Secondary | ICD-10-CM

## 2021-10-20 LAB — T3, FREE: T3, Free: 3.8 pg/mL (ref 2.3–4.2)

## 2021-10-20 LAB — T4, FREE: Free T4: 0.97 ng/dL (ref 0.60–1.60)

## 2021-10-20 LAB — TSH: TSH: 0.02 u[IU]/mL — ABNORMAL LOW (ref 0.35–5.50)

## 2021-10-20 NOTE — Progress Notes (Signed)
   Subjective:    Patient ID: Ronny Flurry, male    DOB: Jan 21, 1966, 55 y.o.   MRN: [id number]  HPI Here to follow up on thyroid replacement. We diagnosed him with hypothyroidism in August, and he has been taking Synthroid since then. He feels fine. His BP is stable. He has lost 3 lbs since then.    Review of Systems  Constitutional: Negative.   Respiratory: Negative.    Cardiovascular: Negative.   Endocrine: Negative.       Objective:   Physical Exam Constitutional:      Appearance: Normal appearance.  Cardiovascular:     Rate and Rhythm: Normal rate and regular rhythm.     Pulses: Normal pulses.     Heart sounds: Normal heart sounds.  Pulmonary:     Effort: Pulmonary effort is normal.     Breath sounds: Normal breath sounds.  Neurological:     Mental Status: He is alert.          Assessment & Plan:  Hypothyroidism. We will check a thyroid panel today. Gershon Crane, MD

## 2022-01-27 ENCOUNTER — Other Ambulatory Visit: Payer: Self-pay

## 2022-01-27 ENCOUNTER — Telehealth: Payer: Self-pay | Admitting: Family Medicine

## 2022-01-27 DIAGNOSIS — E039 Hypothyroidism, unspecified: Secondary | ICD-10-CM

## 2022-01-27 MED ORDER — LEVOTHYROXINE SODIUM 75 MCG PO TABS: 75.0000 ug | ORAL_TABLET | Freq: Every day | ORAL | 0 refills | Status: DC

## 2022-01-27 NOTE — Telephone Encounter (Signed)
Patient called in stating that he is out of town for his anniversary and left his levothyroxine (SYNTHROID) 75 MCG tablet [[id number]]  and cyclobenzaprine (FLEXERIL) 10 MG tablet [[id number]] back home. Patient wants to know if he'll be fine if he went without taking his  levothyroxine (SYNTHROID) 75 MCG tablet [[id number]] . ? ?Patient could be contacted at [phone]. ? ?Please advise. ?

## 2022-01-27 NOTE — Telephone Encounter (Signed)
Please advise on how many days can the patient be okay not taking Levothyroxine. ?

## 2022-01-27 NOTE — Telephone Encounter (Signed)
I would prefer he not miss any more then 2 days of the Levothyroxine. If he needs more than that, please send in a small supply to where he is  ?

## 2022-01-27 NOTE — Telephone Encounter (Addendum)
Spoke with patient.  He is currently at [facility] and will be returning on Sunday evening 01/31/22.    5 day supply for Levothyroxine has [facility] to [facility] [address] in [place]. ?

## 2022-04-20 ENCOUNTER — Emergency Department (HOSPITAL_COMMUNITY): Payer: 59

## 2022-04-20 ENCOUNTER — Encounter (HOSPITAL_COMMUNITY): Payer: Self-pay

## 2022-04-20 ENCOUNTER — Emergency Department (HOSPITAL_COMMUNITY)
Admission: EM | Admit: 2022-04-20 | Discharge: 2022-04-20 | Disposition: A | Discharge status: Home / Self Care | Payer: 59 | Attending: Emergency Medicine | Admitting: Emergency Medicine

## 2022-04-20 ENCOUNTER — Emergency Department (HOSPITAL_BASED_OUTPATIENT_CLINIC_OR_DEPARTMENT_OTHER)
Admit: 2022-04-20 | Discharge: 2022-04-20 | Disposition: A | Discharge status: Home / Self Care | Payer: 59 | Attending: Emergency Medicine | Admitting: Emergency Medicine

## 2022-04-20 DIAGNOSIS — M7989 Other specified soft tissue disorders: Secondary | ICD-10-CM

## 2022-04-20 DIAGNOSIS — E039 Hypothyroidism, unspecified: Secondary | ICD-10-CM | POA: Insufficient documentation

## 2022-04-20 DIAGNOSIS — M25511 Pain in right shoulder: Secondary | ICD-10-CM | POA: Diagnosis present

## 2022-04-20 DIAGNOSIS — R531 Weakness: Secondary | ICD-10-CM | POA: Insufficient documentation

## 2022-04-20 DIAGNOSIS — Z79899 Other long term (current) drug therapy: Secondary | ICD-10-CM | POA: Diagnosis not present

## 2022-04-20 DIAGNOSIS — M79601 Pain in right arm: Secondary | ICD-10-CM

## 2022-04-20 DIAGNOSIS — I10 Essential (primary) hypertension: Secondary | ICD-10-CM | POA: Diagnosis not present

## 2022-04-20 LAB — CBC WITH DIFFERENTIAL/PLATELET
Abs Immature Granulocytes: 0.02 10*3/uL (ref 0.00–0.07)
Basophils Absolute: 0.1 10*3/uL (ref 0.0–0.1)
Basophils Relative: 1 %
Eosinophils Absolute: 0.3 10*3/uL (ref 0.0–0.5)
Eosinophils Relative: 3 %
HCT: 42.7 % (ref 39.0–52.0)
Hemoglobin: 13.3 g/dL (ref 13.0–17.0)
Immature Granulocytes: 0 %
Lymphocytes Relative: 33 %
Lymphs Abs: 2.9 10*3/uL (ref 0.7–4.0)
MCH: 26.1 pg (ref 26.0–34.0)
MCHC: 31.1 g/dL (ref 30.0–36.0)
MCV: 83.9 fL (ref 80.0–100.0)
Monocytes Absolute: 0.8 10*3/uL (ref 0.1–1.0)
Monocytes Relative: 9 %
Neutro Abs: 4.9 10*3/uL (ref 1.7–7.7)
Neutrophils Relative %: 54 %
Platelets: 276 10*3/uL (ref 150–400)
RBC: 5.09 MIL/uL (ref 4.22–5.81)
RDW: 13.4 % (ref 11.5–15.5)
WBC: 8.9 10*3/uL (ref 4.0–10.5)
nRBC: 0 % (ref 0.0–0.2)

## 2022-04-20 LAB — BASIC METABOLIC PANEL
Anion gap: 9 (ref 5–15)
BUN: 7 mg/dL (ref 6–20)
CO2: 27 mmol/L (ref 22–32)
Calcium: 9.4 mg/dL (ref 8.9–10.3)
Chloride: 105 mmol/L (ref 98–111)
Creatinine, Ser: 1.1 mg/dL (ref 0.61–1.24)
GFR, Estimated: >60 mL/min (ref >60)
Glucose, Bld: 101 mg/dL — ABNORMAL HIGH (ref 70–99)
Potassium: 4 mmol/L (ref 3.5–5.1)
Sodium: 141 mmol/L (ref 135–145)

## 2022-04-20 MED ORDER — IOHEXOL 350 MG/ML SOLN
100.0000 mL | Freq: Once | INTRAVENOUS | Status: AC | PRN
  Administered 2022-04-20: 100 mL via INTRAVENOUS

## 2022-04-20 MED ORDER — PREDNISONE 10 MG (21) PO TBPK: ORAL_TABLET | Freq: Every day | ORAL | 0 refills | Status: DC

## 2022-04-20 MED ORDER — PREDNISONE 10 MG PO TABS: 40.0000 mg | ORAL_TABLET | Freq: Every day | ORAL | 0 refills | Status: DC

## 2022-04-20 NOTE — ED Triage Notes (Signed)
Pt c/o R shoulder pain for one week. Denies known trauma/injury. States pain causes intermittent tingling sensation in his fingertips.
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
## 2022-04-20 NOTE — Progress Notes (Signed)
Right upper extremity venous duplex has been completed. Preliminary results can be found in [facility] through chart review.  Results were given to Dr. Durwin Nora.  04/20/22 12:36 PM Olen Cordial RVT

## 2022-04-20 NOTE — Discharge Instructions (Addendum)
Continue scheduled ibuprofen and as needed muscle relaxers for treatment of pain and discomfort.  Additionally, a prescription for a steroid taper was sent to your pharmacy to help minimize inflammation.  Schedule a follow-up appoint with Dr. Shon Baton to discuss your symptoms and possible further testing.    There was an incidental finding of a 1.8 cm lesion in your liver.  This is likely a cyst but you should inform your primary care doctor of this to schedule follow-up imaging study.    Return to the emergency department at any time for any new or worsening symptoms.

## 2022-05-05 ENCOUNTER — Inpatient Hospital Stay: Payer: 59 | Admitting: Family Medicine

## 2022-05-17 ENCOUNTER — Ambulatory Visit: Payer: 59 | Admitting: Family Medicine

## 2022-05-17 ENCOUNTER — Encounter: Payer: Self-pay | Admitting: Family Medicine

## 2022-05-17 VITALS — BP 120/86 | HR 78 | Temp 98.5°F | Wt 197.0 lb

## 2022-05-17 DIAGNOSIS — K769 Liver disease, unspecified: Secondary | ICD-10-CM

## 2022-05-17 DIAGNOSIS — M5412 Radiculopathy, cervical region: Secondary | ICD-10-CM
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
## 2022-05-24 ENCOUNTER — Telehealth: Payer: Self-pay | Admitting: Family Medicine

## 2022-05-24 ENCOUNTER — Ambulatory Visit
Admission: RE | Admit: 2022-05-24 | Discharge: 2022-05-24 | Disposition: A | Discharge status: Home / Self Care | Payer: 59 | Source: Ambulatory Visit | Attending: Family Medicine | Admitting: Family Medicine

## 2022-05-24 DIAGNOSIS — K769 Liver disease, unspecified: Secondary | ICD-10-CM

## 2022-05-24 NOTE — Telephone Encounter (Signed)
Ray @ [facility]  [phone]  Calling to inform us that he is faxing over the Pt's abdomen ultra sound. I confirmed that once it was received, it would be forwarded to the MD.

## 2022-05-25 NOTE — Addendum Note (Signed)
Addended by: Gershon Crane A on: 05/25/2022 08:13 AM   Modules accepted: Orders

## 2022-06-08 ENCOUNTER — Ambulatory Visit
Admission: RE | Admit: 2022-06-08 | Discharge: 2022-06-08 | Disposition: A | Discharge status: Home / Self Care | Payer: 59 | Source: Ambulatory Visit | Attending: Family Medicine | Admitting: Family Medicine

## 2022-06-08 DIAGNOSIS — K769 Liver disease, unspecified: Secondary | ICD-10-CM

## 2022-06-08 MED ORDER — GADOBENATE DIMEGLUMINE 529 MG/ML IV SOLN
19.0000 mL | Freq: Once | INTRAVENOUS | Status: AC | PRN
  Administered 2022-06-08: 19 mL via INTRAVENOUS

## 2022-07-10 ENCOUNTER — Other Ambulatory Visit: Payer: Self-pay | Admitting: Family Medicine

## 2022-07-10 DIAGNOSIS — E039 Hypothyroidism, unspecified: Secondary | ICD-10-CM

## 2022-10-04 ENCOUNTER — Other Ambulatory Visit: Payer: Self-pay | Admitting: Family Medicine

## 2022-10-04 DIAGNOSIS — E039 Hypothyroidism, unspecified: Secondary | ICD-10-CM

## 2022-10-08 IMAGING — CT CT ANGIO EXTREM UP*R*
2 of 6 series · 11 of 46 positions shown, 12 images · non-contrast
Comparison: None Available.

CLINICAL DATA: Intermittent right upper extremity tingling and
vascular murmur.

EXAM:
CT ANGIOGRAPHY OF THE right upperEXTREMITY
TECHNIQUE: Multidetector CT imaging of the right upperwas performed using the
standard protocol during bolus administration of intravenous
contrast. Multiplanar CT image reconstructions and MIPs were
obtained to evaluate the vascular anatomy.

[Series 7: axial arterial · axial · arterial · 0.79mm/px · z∈[+520,+1254]mm · 8 of 283 slices shown, 9 images]
[im 19/283  soft-tissue]
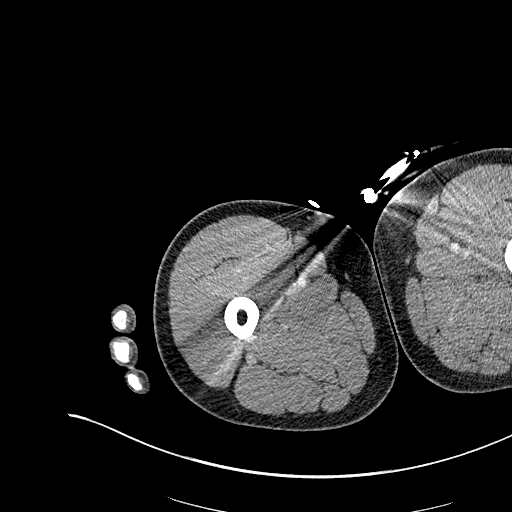
[im 19/283  bone]
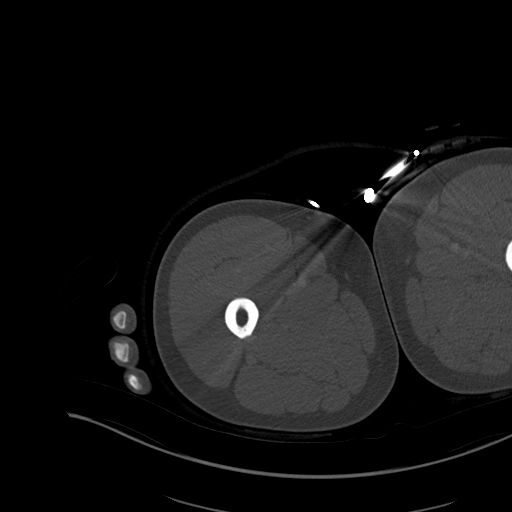
[im 57/283  soft-tissue]
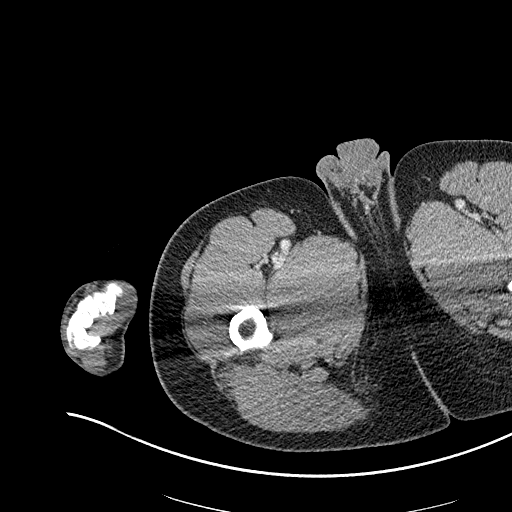
[im 95/283  soft-tissue]
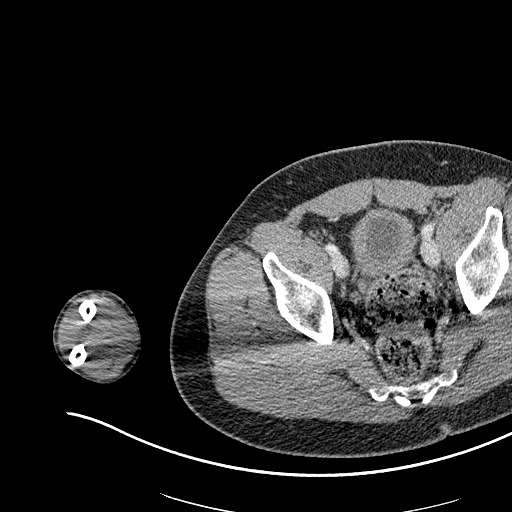
[im 132/283  soft-tissue]
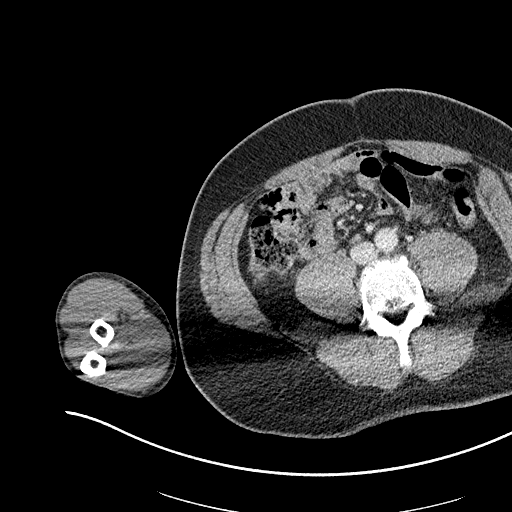
[im 151/283  soft-tissue]
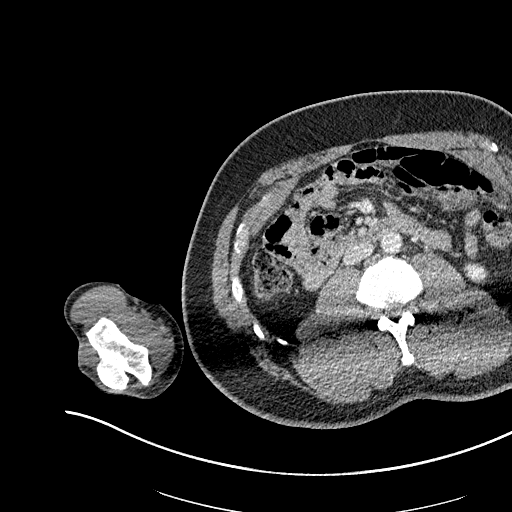
[im 189/283  soft-tissue]
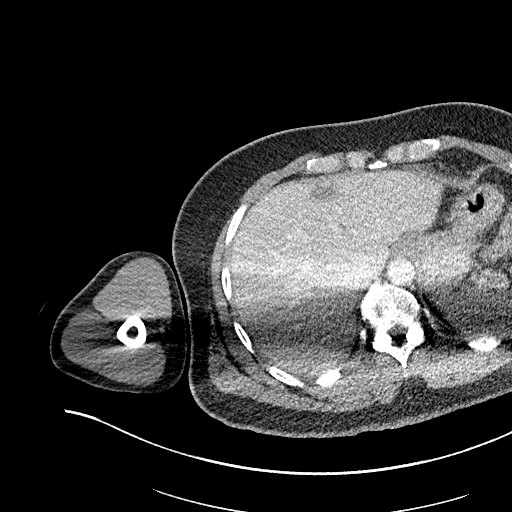
[im 226/283  soft-tissue]
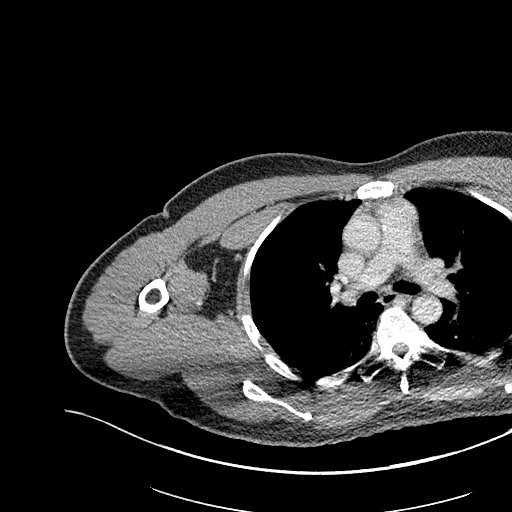
[im 264/283  soft-tissue]
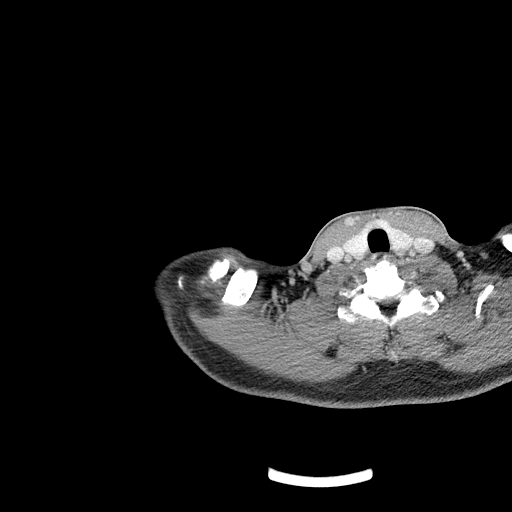

[Series 10: coronals · coronal · 0.64mm/px · 3 of 138 slices shown]
[im 35/138  soft-tissue]
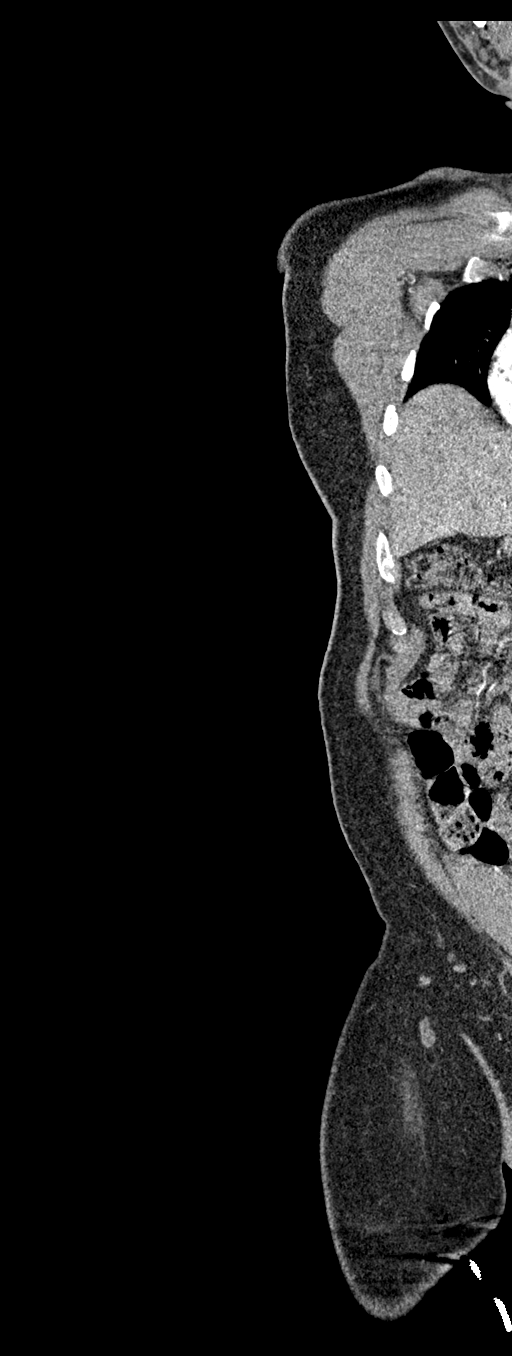
[im 69/138  soft-tissue]
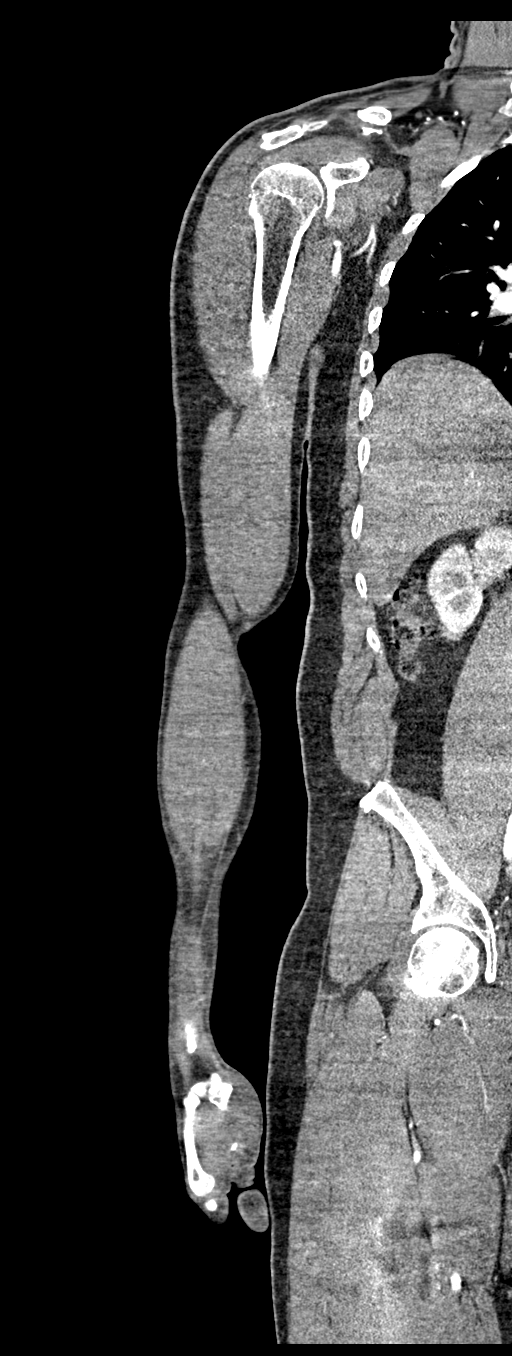
[im 103/138  soft-tissue]
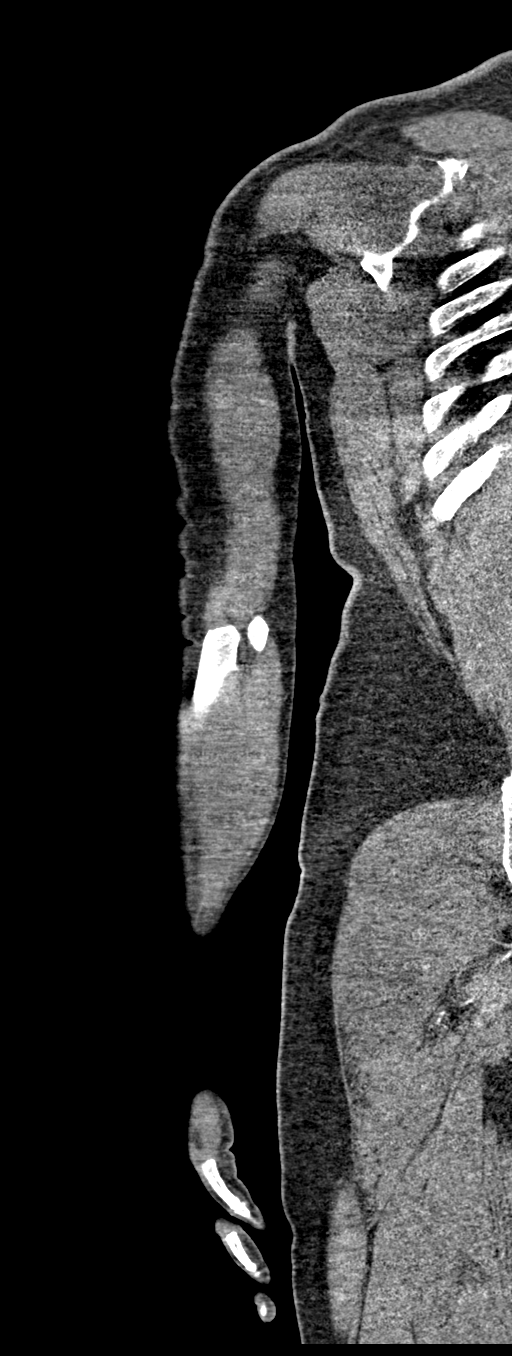

[11 of 46 positions shown; findings below may reference images not displayed]

RADIATION DOSE REDUCTION: This exam was performed according to the
departmental dose-optimization program which includes automated
exposure control, adjustment of the mA and/or kV according to
patient size and/or use of iterative reconstruction technique.

CONTRAST:  100mL OMNIPAQUE IOHEXOL 350 MG/ML SOLN
FINDINGS: Aorta: Conventional 3 vessel arch anatomy. No evidence of dissection
or aneurysm.

Right brachiocephalic artery normal.

Right subclavian artery: Normal.

Right axillary artery: Normal.

Right brachial artery: Normal.

Forearm arteries: Slightly limited evaluation secondary to contrast
bolus timing. No focal abnormality of the radial, ulnar or intra
osseous arteries.

Review of the MIP images confirms the above findings.

Inadvertent scanning of nearly the entire body performed due to arm
down positioning.

CHEST

Heart is normal in size. Normal appearance of the thyroid gland and
mediastinum. No mediastinal mass or lymphadenopathy. The lungs are
grossly clear. No acute osseous abnormality. Nonunion of remote
prior mid clavicular fracture. Mild degenerative osteoarthritis at
the acromioclavicular joint. Partially imaged surgical changes of
prior discectomy.

ABDOMEN/PELVIS

Incompletely evaluated 1.8 cm low-attenuation lesion in hepatic
segment 4A/4B. There is a second smaller low-attenuation lesion in
segment [DATE] measuring only 0.5 cm. The gallbladder is unremarkable.
No intra or extrahepatic biliary ductal dilatation.

Imaged portions of the pancreas and spleen are unremarkable.

Unremarkable appearance of the adrenal glands and the imaged
portions of the kidneys as well as the ureters. The bladder is under
distended.

Stomach, duodenum are within normal limits. No evidence of bowel
obstruction. Large volume of formed stool in the rectum suggests
constipation.

No acute osseous abnormality.
IMPRESSION: 1. Negative CT arteriogram of the right upper extremity. No evidence
of aneurysm, dissection or significant atherosclerotic plaque.
2. Chronic nonunion of a remote clavicle fracture. Local mass-effect
could result in some external compression on branches of the
brachial plexus.
3. Partially imaged findings of prior cervical discectomy.
4. Mild right AC joint degenerative osteoarthritis.
5. Incompletely evaluated 1.8 cm low-attenuation lesion in hepatic
segment 4A/4B. Differential considerations include cyst versus
primary hepatic lesion. Consider further evaluation with right upper
quadrant ultrasound as an initial mode of evaluation.
6. Otherwise, no acute or clinically significant findings in the
incidentally imaged chest, abdomen and pelvis.

## 2022-10-08 IMAGING — CR DG SHOULDER 2+V*R*
3 series · 3 of 3 positions shown · non-contrast
Comparison: None Available.

CLINICAL DATA: Right shoulder stiffness.

EXAM:
RIGHT SHOULDER - 2+ VIEW

[w shoulder external right]
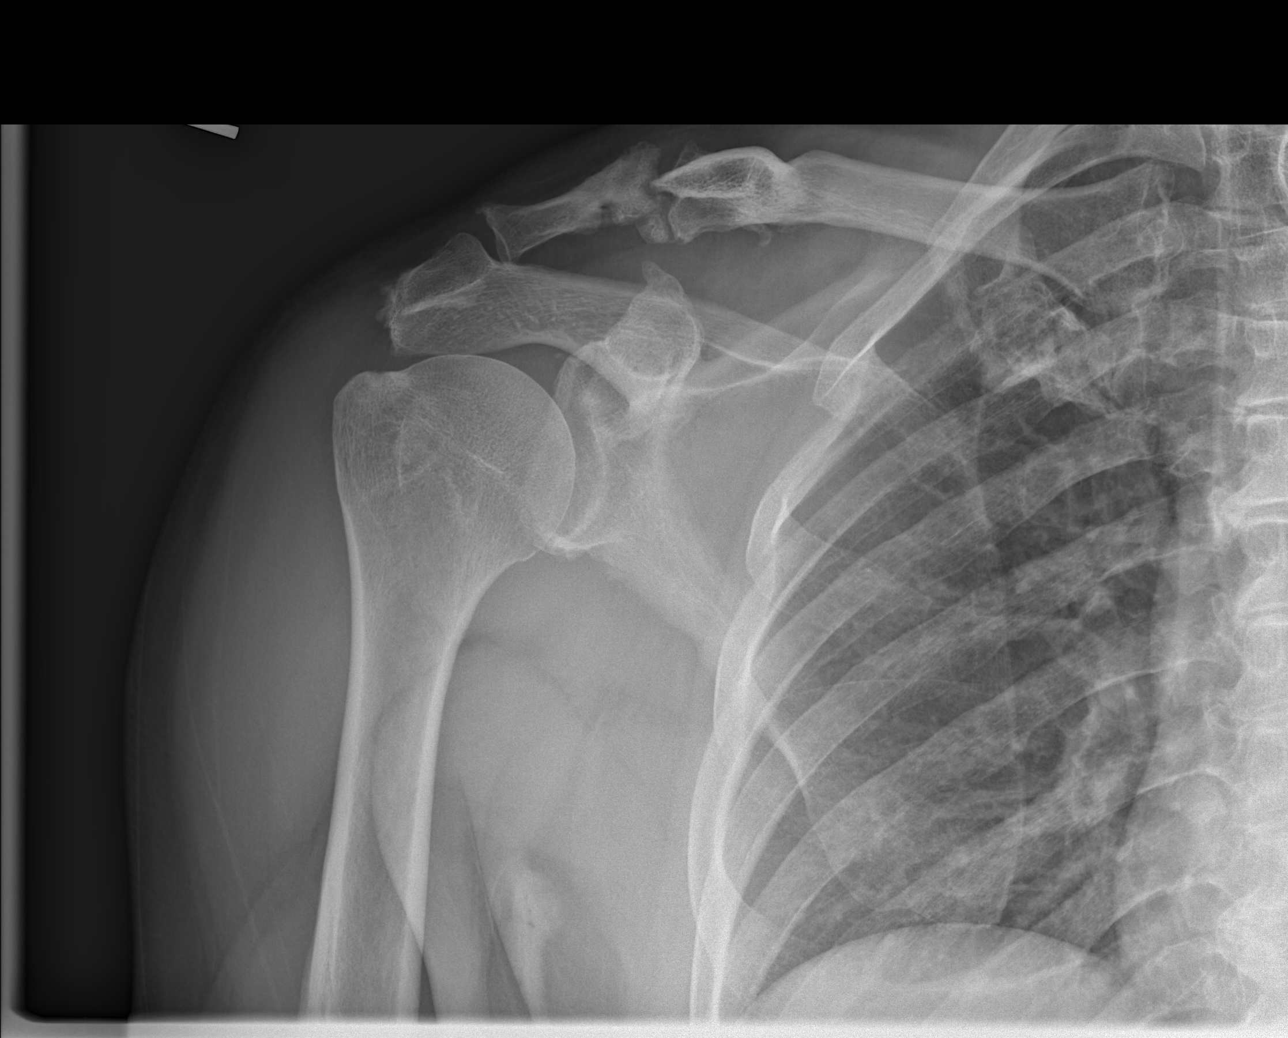

[w shoulder y-view right]
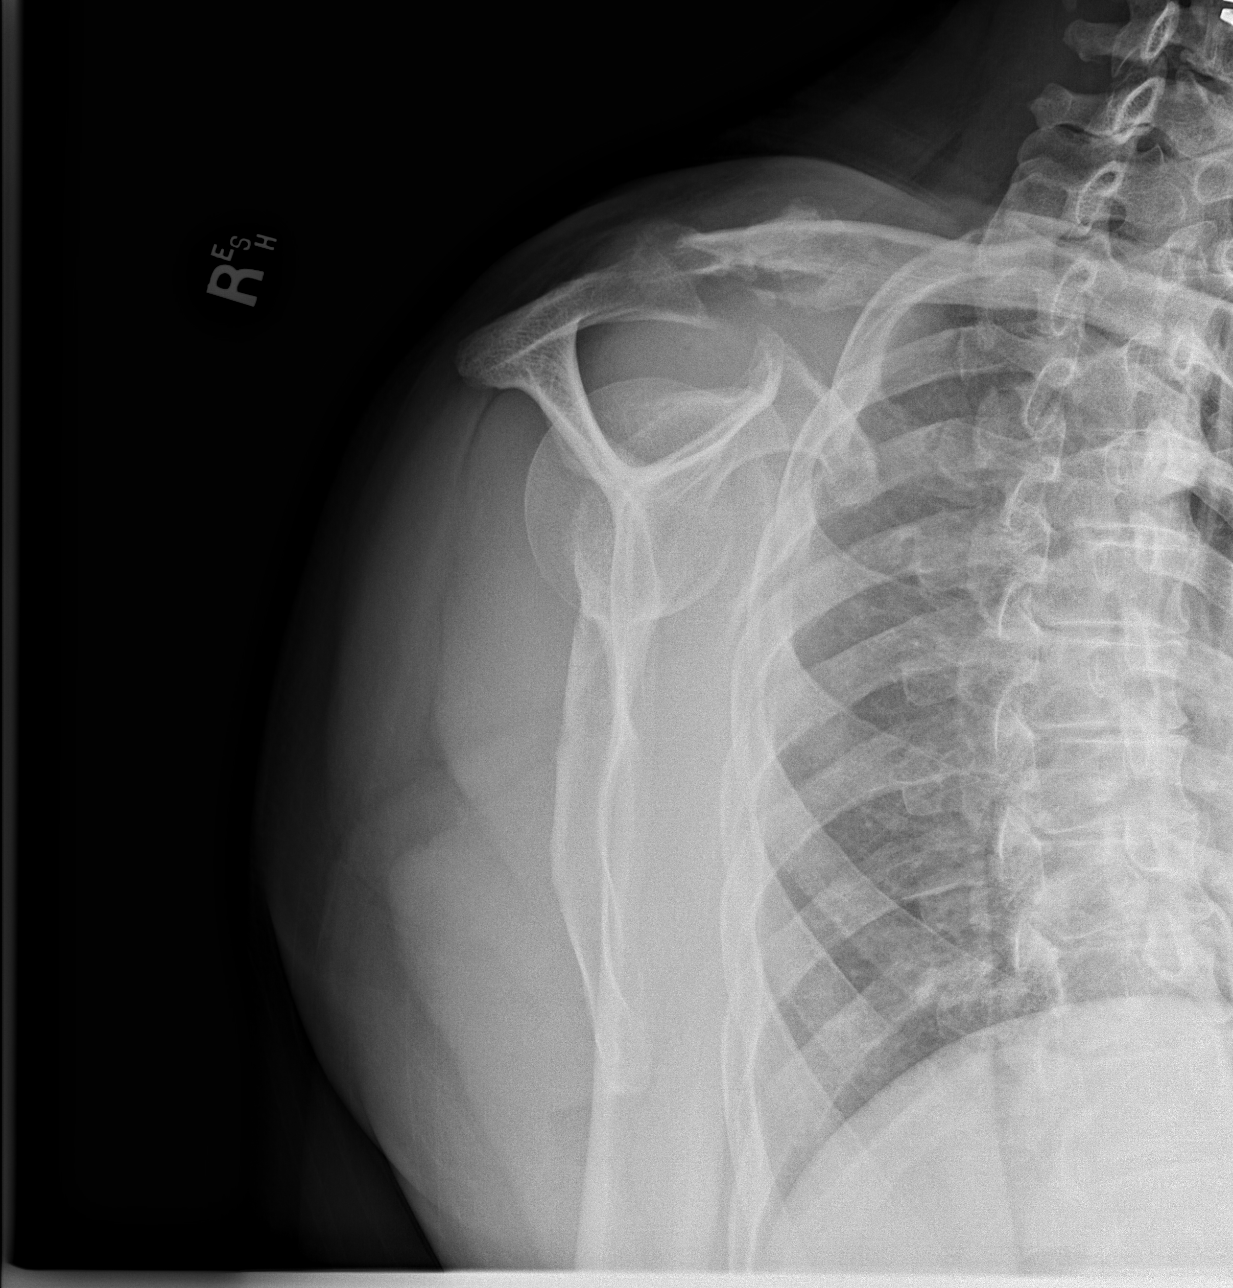

[x shoulder axillary right]
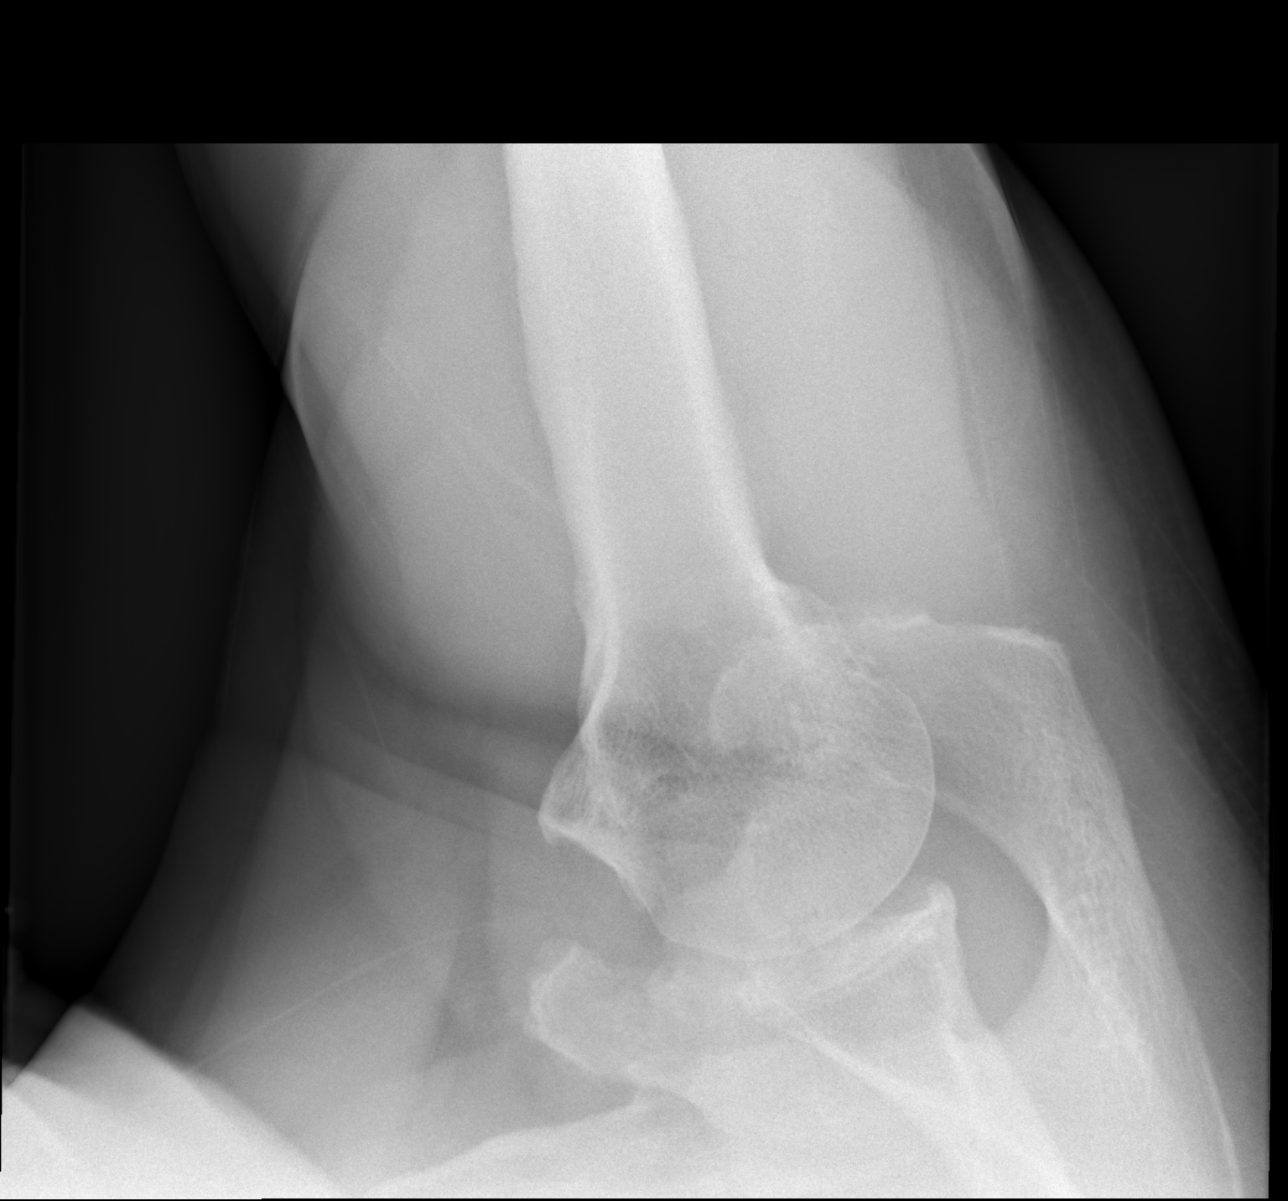

[3 of 3 positions shown; findings below may reference images not displayed]

FINDINGS: There is no evidence of acute fracture or dislocation. Extensive
posttraumatic changes from chronic nonunion fracture of the distal
third of the right clavicle with pseudo joint formation and
irregular callus. Associated likely posttraumatic deformity of the
coracoid process.

Minimal osteoarthritic changes at glenohumeral and acromioclavicular
joints.
IMPRESSION: 1. Extensive posttraumatic changes from chronic nonunion fracture of
the distal third of the right clavicle with pseudo joint formation
and irregular callus.
2. Associated likely posttraumatic deformity of the coracoid
process.

## 2022-11-22 ENCOUNTER — Other Ambulatory Visit: Payer: Self-pay | Admitting: Family Medicine

## 2022-11-23 NOTE — Telephone Encounter (Signed)
Last refill-07-28-2021*-90 tabs, 1 refill Last OV-05/17/22  No future OV scheduled.

## 2022-12-25 ENCOUNTER — Other Ambulatory Visit: Payer: Self-pay | Admitting: Family Medicine

## 2022-12-30 NOTE — Telephone Encounter (Signed)
Alternative requested, please advise

## 2022-12-31 NOTE — Telephone Encounter (Signed)
What alternative will they cover?

## 2023-01-08 ENCOUNTER — Other Ambulatory Visit: Payer: Self-pay | Admitting: Family Medicine

## 2023-01-08 DIAGNOSIS — E039 Hypothyroidism, unspecified: Secondary | ICD-10-CM

## 2023-01-17 NOTE — Telephone Encounter (Signed)
Spoke with pt stated that he picked up Rx Flexeril 10 mg from his pharmacy, pt stated that his insurance covered the Rx

## 2023-04-10 ENCOUNTER — Other Ambulatory Visit: Payer: Self-pay | Admitting: Family Medicine

## 2023-04-10 DIAGNOSIS — E039 Hypothyroidism, unspecified: Secondary | ICD-10-CM

## 2023-05-23 ENCOUNTER — Encounter: Payer: Self-pay | Admitting: Family Medicine

## 2023-05-23 ENCOUNTER — Ambulatory Visit (INDEPENDENT_AMBULATORY_CARE_PROVIDER_SITE_OTHER): Payer: 59 | Admitting: Family Medicine

## 2023-05-23 VITALS — BP 128/86 | HR 72 | Temp 98.4°F | Ht 69.0 in | Wt 198.0 lb

## 2023-05-23 DIAGNOSIS — Z0000 Encounter for general adult medical examination without abnormal findings: Secondary | ICD-10-CM | POA: Diagnosis not present

## 2023-05-23 DIAGNOSIS — E039 Hypothyroidism, unspecified: Secondary | ICD-10-CM

## 2023-05-23 LAB — HEPATIC FUNCTION PANEL
ALT: 14 U/L (ref 0–53)
AST: 14 U/L (ref 0–37)
Albumin: 4.2 g/dL (ref 3.5–5.2)
Alkaline Phosphatase: 42 U/L (ref 39–117)
Bilirubin, Direct: 0.1 mg/dL (ref 0.0–0.3)
Total Bilirubin: 0.5 mg/dL (ref 0.2–1.2)
Total Protein: 7.3 g/dL (ref 6.0–8.3)

## 2023-05-23 LAB — LIPID PANEL
Cholesterol: 181 mg/dL (ref 0–200)
HDL: 64.1 mg/dL (ref >39.00)
LDL Cholesterol: 99 mg/dL (ref 0–99)
NonHDL: 117.37
Total CHOL/HDL Ratio: 3
Triglycerides: 92 mg/dL (ref 0.0–149.0)
VLDL: 18.4 mg/dL (ref 0.0–40.0)

## 2023-05-23 LAB — CBC WITH DIFFERENTIAL/PLATELET
Basophils Absolute: 0.1 10*3/uL (ref 0.0–0.1)
Basophils Relative: 0.7 % (ref 0.0–3.0)
Eosinophils Absolute: 0.2 10*3/uL (ref 0.0–0.7)
Eosinophils Relative: 2.8 % (ref 0.0–5.0)
HCT: 42.7 % (ref 39.0–52.0)
Hemoglobin: 13.4 g/dL (ref 13.0–17.0)
Lymphocytes Relative: 38.5 % (ref 12.0–46.0)
Lymphs Abs: 2.8 10*3/uL (ref 0.7–4.0)
MCHC: 31.4 g/dL (ref 30.0–36.0)
MCV: 81.9 fl (ref 78.0–100.0)
Monocytes Absolute: 0.6 10*3/uL (ref 0.1–1.0)
Monocytes Relative: 9 % (ref 3.0–12.0)
Neutro Abs: 3.5 10*3/uL (ref 1.4–7.7)
Neutrophils Relative %: 49 % (ref 43.0–77.0)
Platelets: 238 10*3/uL (ref 150.0–400.0)
RBC: 5.22 Mil/uL (ref 4.22–5.81)
RDW: 14.2 % (ref 11.5–15.5)
WBC: 7.2 10*3/uL (ref 4.0–10.5)

## 2023-05-23 LAB — BASIC METABOLIC PANEL
BUN: 10 mg/dL (ref 6–23)
CO2: 29 mEq/L (ref 19–32)
Calcium: 9.5 mg/dL (ref 8.4–10.5)
Chloride: 101 mEq/L (ref 96–112)
Creatinine, Ser: 1.21 mg/dL (ref 0.40–1.50)
GFR: 66.44 mL/min (ref >60.00)
Glucose, Bld: 99 mg/dL (ref 70–99)
Potassium: 4.5 mEq/L (ref 3.5–5.1)
Sodium: 139 mEq/L (ref 135–145)

## 2023-05-23 LAB — TSH: TSH: 0.62 u[IU]/mL (ref 0.35–5.50)

## 2023-05-23 LAB — T3, FREE: T3, Free: 2.8 pg/mL (ref 2.3–4.2)

## 2023-05-23 LAB — T4, FREE: Free T4: 0.72 ng/dL (ref 0.60–1.60)

## 2023-05-23 LAB — PSA: PSA: 0.86 ng/mL (ref 0.10–4.00)

## 2023-05-23 LAB — HEMOGLOBIN A1C: Hgb A1c MFr Bld: 6.5 % (ref 4.6–6.5)

## 2023-05-23 NOTE — Progress Notes (Signed)
Subjective:    Patient ID: Ronny Flurry, male    DOB: 02-Jan-1966, 57 y.o.   MRN: [id number]  HPI Here for a well exam. He feels well.    Review of Systems  Constitutional: Negative.   HENT: Negative.    Eyes: Negative.   Respiratory: Negative.    Cardiovascular: Negative.   Gastrointestinal: Negative.   Genitourinary: Negative.   Musculoskeletal: Negative.   Skin: Negative.   Neurological: Negative.   Psychiatric/Behavioral: Negative.         Objective:   Physical Exam Constitutional:      General: He is not in acute distress.    Appearance: Normal appearance. He is well-developed. He is not diaphoretic.  HENT:     Head: Normocephalic and atraumatic.     Right Ear: External ear normal.     Left Ear: External ear normal.     Nose: Nose normal.     Mouth/Throat:     Pharynx: No oropharyngeal exudate.  Eyes:     General: No scleral icterus.       Right eye: No discharge.        Left eye: No discharge.     Conjunctiva/sclera: Conjunctivae normal.     Pupils: Pupils are equal, round, and reactive to light.  Neck:     Thyroid: No thyromegaly.     Vascular: No JVD.     Trachea: No tracheal deviation.  Cardiovascular:     Rate and Rhythm: Normal rate and regular rhythm.     Pulses: Normal pulses.     Heart sounds: Normal heart sounds. No murmur heard.    No friction rub. No gallop.  Pulmonary:     Effort: Pulmonary effort is normal. No respiratory distress.     Breath sounds: Normal breath sounds. No wheezing or rales.  Chest:     Chest wall: No tenderness.  Abdominal:     General: Bowel sounds are normal. There is no distension.     Palpations: Abdomen is soft. There is no mass.     Tenderness: There is no abdominal tenderness. There is no guarding or rebound.  Genitourinary:    Penis: Normal. No tenderness.      Testes: Normal.     Prostate: Normal.     Rectum: Normal. Guaiac result negative.  Musculoskeletal:        General: No tenderness. Normal  range of motion.     Cervical back: Neck supple.  Lymphadenopathy:     Cervical: No cervical adenopathy.  Skin:    General: Skin is warm and dry.     Coloration: Skin is not pale.     Findings: No erythema or rash.  Neurological:     General: No focal deficit present.     Mental Status: He is alert and oriented to person, place, and time.     Cranial Nerves: No cranial nerve deficit.     Motor: No abnormal muscle tone.     Coordination: Coordination normal.     Deep Tendon Reflexes: Reflexes are normal and symmetric. Reflexes normal.  Psychiatric:        Mood and Affect: Mood normal.        Behavior: Behavior normal.        Thought Content: Thought content normal.        Judgment: Judgment normal.           Assessment & Plan:  Well exam. We discussed diet and exercise. Get fasting labs. Gershon Crane,  MD

## 2023-07-09 ENCOUNTER — Other Ambulatory Visit: Payer: Self-pay | Admitting: Family Medicine

## 2023-07-09 DIAGNOSIS — E039 Hypothyroidism, unspecified: Secondary | ICD-10-CM

## 2023-07-11 ENCOUNTER — Encounter (HOSPITAL_COMMUNITY): Payer: Self-pay

## 2023-07-11 ENCOUNTER — Emergency Department (HOSPITAL_COMMUNITY)
Admission: EM | Admit: 2023-07-11 | Discharge: 2023-07-11 | Disposition: A | Discharge status: Home / Self Care | Payer: 59 | Attending: Emergency Medicine | Admitting: Emergency Medicine

## 2023-07-11 ENCOUNTER — Emergency Department (HOSPITAL_COMMUNITY): Payer: 59

## 2023-07-11 ENCOUNTER — Other Ambulatory Visit: Payer: Self-pay

## 2023-07-11 DIAGNOSIS — R079 Chest pain, unspecified: Secondary | ICD-10-CM | POA: Diagnosis present

## 2023-07-11 DIAGNOSIS — Z79899 Other long term (current) drug therapy: Secondary | ICD-10-CM | POA: Insufficient documentation

## 2023-07-11 DIAGNOSIS — E039 Hypothyroidism, unspecified: Secondary | ICD-10-CM | POA: Diagnosis not present

## 2023-07-11 DIAGNOSIS — I10 Essential (primary) hypertension: Secondary | ICD-10-CM | POA: Insufficient documentation

## 2023-07-11 DIAGNOSIS — R002 Palpitations: Secondary | ICD-10-CM

## 2023-07-11 HISTORY — DX: Disorder of thyroid, unspecified: E07.9

## 2023-07-11 LAB — BASIC METABOLIC PANEL
Anion gap: 18 — ABNORMAL HIGH (ref 5–15)
BUN: 9 mg/dL (ref 6–20)
CO2: 22 mmol/L (ref 22–32)
Calcium: 9.3 mg/dL (ref 8.9–10.3)
Chloride: 100 mmol/L (ref 98–111)
Creatinine, Ser: 1.28 mg/dL — ABNORMAL HIGH (ref 0.61–1.24)
GFR, Estimated: >60 mL/min (ref >60)
Glucose, Bld: 120 mg/dL — ABNORMAL HIGH (ref 70–99)
Potassium: 3.8 mmol/L (ref 3.5–5.1)
Sodium: 140 mmol/L (ref 135–145)

## 2023-07-11 LAB — CBC
HCT: 41.9 % (ref 39.0–52.0)
Hemoglobin: 13 g/dL (ref 13.0–17.0)
MCH: 25.4 pg — ABNORMAL LOW (ref 26.0–34.0)
MCHC: 31 g/dL (ref 30.0–36.0)
MCV: 82 fL (ref 80.0–100.0)
Platelets: 284 10*3/uL (ref 150–400)
RBC: 5.11 MIL/uL (ref 4.22–5.81)
RDW: 13.3 % (ref 11.5–15.5)
WBC: 8.1 10*3/uL (ref 4.0–10.5)
nRBC: 0 % (ref 0.0–0.2)

## 2023-07-11 LAB — TROPONIN I (HIGH SENSITIVITY)
Troponin I (High Sensitivity): <2 ng/L (ref <18)
Troponin I (High Sensitivity): 3 ng/L (ref <18)

## 2023-07-11 MED ORDER — AMLODIPINE BESYLATE 5 MG PO TABS: 5.0000 mg | ORAL_TABLET | Freq: Every day | ORAL | 0 refills | Status: DC

## 2023-07-11 NOTE — ED Provider Notes (Signed)
[place] EMERGENCY DEPARTMENT AT [facility] Provider Note   CSN: [id number] Arrival date & time: 07/11/23  1421     History  Chief Complaint  Patient presents with   Chest Pain    William Novak is a 57 y.o. male with past medical history of hypothyroidism and low back pain presenting for chest pain that occurred 5 days ago associated with palpitations.  Said he was sleeping when it occurred and the pain woke him up the pain and the palpitations lasted for a few seconds and then resolved.  He has had no episodes since then.  He reports prior episodes of palpitations he thinks it occurs 1 or 2 times every month.  Denies any other associated symptoms.  Reports no known cardiac history.  No associated shortness of breath fevers chills, no lower extremity edema or pain that would suggest DVT. No smoking, drugs or EtOH. No recent surgery.    Chest Pain Associated symptoms: palpitations        Home Medications Prior to Admission medications   Medication Sig Start Date End Date Taking? Authorizing Provider  cyclobenzaprine (FLEXERIL) 10 MG tablet TAKE 1 TABLET BY MOUTH 3 TIMES DAILY AS NEEDED FOR MUSCLE SPASMS 11/24/22   Nelwyn Salisbury, MD  diphenhydrAMINE (BENADRYL) 25 mg capsule Take 25 mg by mouth every 6 (six) hours as needed. For allergies    [provider]  fluticasone (FLONASE) 50 MCG/ACT nasal spray Place into both nostrils daily.    [provider]  levothyroxine (SYNTHROID) 75 MCG tablet TAKE 1 TABLET (75 MCG TOTAL) BY MOUTH DAILY BEFORE BREAKFAST. *LABS REQUIRED FOR FUTURE REFILLS* 04/12/23   Nelwyn Salisbury, MD  triamcinolone cream (KENALOG) 0.1 % Apply 1 application topically 2 (two) times daily. 08/06/20   Burchette, Elberta Fortis, MD      Allergies    Patient has no known allergies.    Review of Systems   Review of Systems  Cardiovascular:  Positive for chest pain and palpitations.    Physical Exam Updated Vital Signs BP (!) 166/89   Pulse  96   Temp 98.4 F (36.9 C) (Oral)   Resp (!) 22   Ht 5\' 9"  (1.753 m)   Wt 89.8 kg   SpO2 98%   BMI 29.24 kg/m  Physical Exam Vitals and nursing note reviewed.  Constitutional:      General: He is not in acute distress.    Appearance: He is not toxic-appearing.  HENT:     Head: Normocephalic and atraumatic.  Eyes:     General: No scleral icterus.    Conjunctiva/sclera: Conjunctivae normal.  Cardiovascular:     Rate and Rhythm: Normal rate and regular rhythm.     Pulses: Normal pulses.     Heart sounds: Normal heart sounds.  Pulmonary:     Effort: Pulmonary effort is normal. No respiratory distress.     Breath sounds: Normal breath sounds.  Abdominal:     General: Abdomen is flat. Bowel sounds are normal.     Palpations: Abdomen is soft.     Tenderness: There is no abdominal tenderness.  Musculoskeletal:     Comments: Upper ext and lower ext strength equal b/l  Skin:    General: Skin is warm and dry.     Findings: No lesion.  Neurological:     General: No focal deficit present.     Mental Status: He is alert and oriented to person, place, and time. Mental status is at  baseline.     ED Results / Procedures / Treatments   Labs (all labs ordered are listed, but only abnormal results are displayed) Labs Reviewed  CBC - Abnormal; Notable for the following components:      Result Value   MCH 25.4 (*)    All other components within normal limits  BASIC METABOLIC PANEL  TROPONIN I (HIGH SENSITIVITY)    EKG None  Radiology DG Chest 2 View  Result Date: 07/11/2023 CLINICAL DATA:  cp EXAM: CHEST - 2 VIEW COMPARISON:  07/24/2012 FINDINGS: Lungs are clear.  No pneumothorax. Heart size and mediastinal contours are within normal limits. No effusion. Post cervical disc replacement. IMPRESSION: No acute cardiopulmonary disease. Electronically Signed   By: Corlis Leak M.D.   On: 07/11/2023 15:27    Procedures Procedures    Medications Ordered in ED Medications - No data  to display  ED Course/ Medical Decision Making/ A&P Clinical Course as of 07/11/23 2320  Mon Jul 11, 2023  1616 Anion gap(!): 18 [JB]  1742 Troponin I (High Sensitivity) [JB]    Clinical Course User Index [JB] Jaelie Aguilera, Horald Chestnut, PA-C                                 Medical Decision Making Amount and/or Complexity of Data Reviewed Labs: ordered. Decision-making details documented in ED Course. Radiology: ordered.  Risk Prescription drug management.   This patient presents to the ED for concern of CP, this involves an extensive number of treatment options, and is a complaint that carries with it a high risk of complications and morbidity.  The differential diagnosis includes acute coronary syndrome, unstable angina, stable angina, pericarditis, myocarditis.  GERD, PVC, PAC    Co morbidities that complicate the patient evaluation  Hypothyroid    Additional history obtained:  Additional history obtained from family at bedside    Lab Tests:  I Ordered, and personally interpreted labs.  The pertinent results include:   Cbc unremarkable  Bmp anion gap 18 - pt denies frequent ASA, Tylenol or Ibuprofen use  Trop wnl Repeat trop    Imaging Studies ordered:  I ordered imaging studies including chest xray   I independently visualized and interpreted imaging which showed no acute finding I agree with the radiologist interpretation   Cardiac Monitoring: / EKG:  The patient was maintained on a cardiac monitor.  I personally viewed and interpreted the cardiac monitored which showed an underlying rhythm of: sinus, no st changes, no axis deviation    Consultations Obtained:  None    Problem List / ED Course / Critical interventions / Medication management  Patient reporting with 1 episode of chest pain that occurred 5 days ago lasting a few seconds that woke him up at night, the chest pain was associated with palpitations that resolved after a few seconds.  He has had  multiple episodes of palpitations in the past.  Does not have any physical exam findings consistent with DVT.  No signs of lower extremity edema.  CMP will proceed with chest pain workup including EKG chest x-ray and repeat 2 troponins. Recent TSH and t4 wnl, no recent rx changes.  I have reviewed the patients home medicines and have made adjustments as needed   Plan F/u w/ cardiology for untreated HTN and palpitations F/u w/ PCP to ensure resolution of sx Return to ED w/ new or worsening sx  Final Clinical Impression(s) / ED Diagnoses Final diagnoses:  None    Rx / DC Orders ED Discharge Orders     None         Raford Pitcher Evalee Jefferson 07/11/23 2321    Eber Hong, MD 07/12/23 1013

## 2023-07-11 NOTE — ED Triage Notes (Signed)
Pt c/o non radiating left sided chest painx1.5wks. Pt denies N/V, SOB

## 2023-07-11 NOTE — Discharge Instructions (Addendum)
Seen in the emergency room today due to chest pain and palpitations.  Your lab work and imaging came back reassuring.  Please call cardiology and schedule an appointment to ensure resolution of symptoms and possible heart monitor.   You were found to have hypertension upon arrival.  Please take amlodipine as prescribed and follow up with PCP to ensure further management.   Return to ED if new symptoms occur or worsen.

## 2023-08-16 ENCOUNTER — Encounter: Payer: Self-pay | Admitting: Family Medicine

## 2023-08-16 ENCOUNTER — Ambulatory Visit: Payer: 59 | Admitting: Family Medicine

## 2023-08-16 VITALS — BP 136/92 | HR 77 | Temp 98.5°F | Wt 196.0 lb

## 2023-08-16 DIAGNOSIS — M7711 Lateral epicondylitis, right elbow: Secondary | ICD-10-CM | POA: Insufficient documentation

## 2023-08-16 DIAGNOSIS — Z23 Encounter for immunization: Secondary | ICD-10-CM | POA: Diagnosis not present

## 2023-08-16 DIAGNOSIS — I10 Essential (primary) hypertension: Secondary | ICD-10-CM | POA: Diagnosis not present

## 2023-08-16 MED ORDER — METOPROLOL SUCCINATE ER 50 MG PO TB24: 50.0000 mg | ORAL_TABLET | Freq: Every day | ORAL | 3 refills | Status: DC

## 2023-08-16 NOTE — Progress Notes (Signed)
   Subjective:    Patient ID: William Novak, male    DOB: 1966/03/11, 57 y.o.   MRN: [id number]  HPI Here to follow up on an ED visit on 07-11-23. That morning he woke up with a midl chest pressure and he felt 3 or 4 quick palpitations in his chest. This quickly resolved. No SOB. At the ED his BP was 166/89, but his rate was regular. An EKG was unremarkable. Labs were normal including a creatinine of 1.28. A CXR was normal. He was started on Amlodipine 5 mg daily. Since then he has felt fine, but he has not checked his BP. In addition today he complains of intermittent pain in the right elbow and forearm for the past month. He has taken Ibuprofen with some relief.    Review of Systems  Constitutional: Negative.   Respiratory: Negative.    Cardiovascular:  Positive for chest pain and palpitations. Negative for leg swelling.  Musculoskeletal:  Positive for arthralgias.  Neurological: Negative.        Objective:   Physical Exam Constitutional:      Appearance: Normal appearance.  Cardiovascular:     Rate and Rhythm: Normal rate and regular rhythm.     Pulses: Normal pulses.     Heart sounds: Normal heart sounds.  Pulmonary:     Effort: Pulmonary effort is normal.     Breath sounds: Normal breath sounds.  Musculoskeletal:     Right lower leg: No edema.     Left lower leg: No edema.     Comments: He is tender over the right lateral epicondyle. The elbow has full ROM with no swelling   Neurological:     Mental Status: He is alert.           Assessment & Plan:  He has developed HTN. We will stop the Amlodipine and start him on Metoprolol succinate 50 mg daily. I advised him to get a BP cuff so he can follow the BP at home. Recheck here in 3-4 weeks. He also has tennis elbow. He will treat this with ice and Voltaren gel as needed.  Gershon Crane, MD

## 2023-09-06 ENCOUNTER — Telehealth: Payer: Self-pay | Admitting: Family Medicine

## 2023-09-06 ENCOUNTER — Encounter: Payer: Self-pay | Admitting: Family Medicine

## 2023-09-06 ENCOUNTER — Ambulatory Visit: Payer: 59 | Admitting: Family Medicine

## 2023-09-06 VITALS — BP 120/82 | HR 68 | Temp 98.1°F | Wt 194.0 lb

## 2023-09-06 DIAGNOSIS — I10 Essential (primary) hypertension: Secondary | ICD-10-CM | POA: Diagnosis not present

## 2023-09-06 MED ORDER — METOPROLOL SUCCINATE ER 50 MG PO TB24: 50.0000 mg | ORAL_TABLET | Freq: Every day | ORAL | 3 refills | Status: DC

## 2023-09-06 NOTE — Progress Notes (Signed)
   Subjective:    Patient ID: William Novak, male    DOB: 1966-03-02, 57 y.o.   MRN: [id number]  HPI Here to follow up on HTN. At our last visit we stopped Amlodipine and started him on Metoprolol succinate 50 mg daily. Since then he has felt well.    Review of Systems  Constitutional: Negative.   Respiratory: Negative.    Cardiovascular: Negative.        Objective:   Physical Exam Constitutional:      Appearance: Normal appearance.  Cardiovascular:     Rate and Rhythm: Normal rate and regular rhythm.     Pulses: Normal pulses.     Heart sounds: Normal heart sounds.  Pulmonary:     Effort: Pulmonary effort is normal.     Breath sounds: Normal breath sounds.  Neurological:     Mental Status: He is alert.           Assessment & Plan:  HTN, now well controlled.  Gershon Crane, MD

## 2023-09-06 NOTE — Telephone Encounter (Signed)
Pt was just seen today. Pt states he was given Rx for Amlodipine 5 mg in the hospital. Pt stated MD prescribed metoprolol succinate (TOPROL-XL) 50 MG 24 hr tablet today. Pt thinks this was a mistake. Pt is asking if MD meant to give him this Rx, as he believes his BP is fine. Pt asked that MD/CMA call back at their earliest convenience.

## 2023-09-07 NOTE — Telephone Encounter (Signed)
We talked about this 2 visits ago. We agreed that the Amlodipine was not working well so stopped it. Then I started him on Metoprolol instead, and I though he was doing better

## 2023-09-07 NOTE — Telephone Encounter (Signed)
FYI Spoke with pt stated that he forgot to pick up Metoprolol Rx and continued taking amlodipine. Pt was advised to stop taking amlodipine and start metoprolol, record his BP reading and send them to Dr Clent Ridges for advise. Pt verbalized understanding

## 2023-09-09 ENCOUNTER — Ambulatory Visit: Payer: 59 | Admitting: Cardiovascular Disease

## 2024-01-04 ENCOUNTER — Other Ambulatory Visit: Payer: Self-pay | Admitting: Family Medicine

## 2024-01-04 DIAGNOSIS — E039 Hypothyroidism, unspecified: Secondary | ICD-10-CM

## 2024-05-04 ENCOUNTER — Other Ambulatory Visit: Payer: Self-pay | Admitting: Family Medicine

## 2024-07-04 ENCOUNTER — Other Ambulatory Visit: Payer: Self-pay | Admitting: Family Medicine

## 2024-07-04 DIAGNOSIS — E039 Hypothyroidism, unspecified: Secondary | ICD-10-CM

## 2024-10-01 ENCOUNTER — Other Ambulatory Visit: Payer: Self-pay | Admitting: Family Medicine

## 2024-10-08 ENCOUNTER — Encounter: Payer: Self-pay | Admitting: Family Medicine

## 2024-10-08 ENCOUNTER — Ambulatory Visit: Admitting: Family Medicine

## 2024-10-08 ENCOUNTER — Ambulatory Visit: Payer: Self-pay

## 2024-10-08 VITALS — BP 128/80 | HR 71 | Temp 98.8°F | Wt 188.4 lb

## 2024-10-08 DIAGNOSIS — M5416 Radiculopathy, lumbar region: Secondary | ICD-10-CM

## 2024-10-08 DIAGNOSIS — M5441 Lumbago with sciatica, right side: Secondary | ICD-10-CM

## 2024-10-08 DIAGNOSIS — G8929 Other chronic pain: Secondary | ICD-10-CM | POA: Diagnosis not present

## 2024-10-08 MED ORDER — METHYLPREDNISOLONE 4 MG PO TBPK: ORAL_TABLET | ORAL | 0 refills | Status: DC

## 2024-10-08 NOTE — Telephone Encounter (Signed)
 FYI Only or Action Required?: FYI only for provider: appointment scheduled on 11/24.  Patient was last seen in primary care on 09/06/2023 by William Garnette LABOR, MD.  Called Nurse Triage reporting Numbness.  Symptoms began several weeks ago.  Interventions attempted: Nothing.  Symptoms are: unchanged.  Triage Disposition: See HCP Within 4 Hours (Or PCP Triage)  Patient/caregiver understands and will follow disposition?: Yes           Copied from CRM #[id number]. Topic: Clinical - Red Word Triage >> Oct 08, 2024  8:40 AM Hamdi H wrote: [facility] that prompted transfer to Nurse Triage: Numbness and tingling in right leg, heel on that leg gets very sore. Reason for Disposition  [1] Numbness (i.e., loss of sensation) of the face, arm / hand, or leg / foot on one side of the body AND [2] gradual onset (e.g., days to weeks) AND [3] present now  Answer Assessment - Initial Assessment Questions 1. SYMPTOM: What is the main symptom you are concerned about? (e.g., weakness, numbness)     Numbness and tingling in right leg-thigh area   2. ONSET: When did this start? (e.g., minutes, hours, days; while sleeping)     X 3 weeks ago   3. LAST NORMAL: When was the last time you (the patient) were normal (no symptoms)?     Has achy heel pain as well, x 3 weeks   4. PATTERN Does this come and go, or has it been constant since it started?  Is it present now?     Intermittent   5. CARDIAC SYMPTOMS: Have you had any of the following symptoms: chest pain, difficulty breathing, palpitations?     No   6. NEUROLOGIC SYMPTOMS: Have you had any of the following symptoms: headache, dizziness, vision loss, double vision, changes in speech, unsteady on your feet?     No   7. OTHER SYMPTOMS: Do you have any other symptoms?  He states at times he has a heavy chest, it happens when he's at work in a hurry. Last time was over a week ago. It only last a few minutes, then goes away.   Appointment scheduled for evaluation. Patient agrees with plan of care, and will call back if anything changes, or if symptoms worsen.  Protocols used: Neurologic Deficit-A-AH

## 2024-10-08 NOTE — Progress Notes (Signed)
   Subjective:    Patient ID: William Novak, male    DOB: 08-Mar-1966, 58 y.o.   MRN: [id number]  HPI Here for 3 weeks of intermittent numbness and buzzing down the right leg. Both legs have been a little weak. His low back pain waxes and wanes. He takes Ibuprofen  with mixed results. His last MRI in 2018 showed a bulging disc at L3-4.    Review of Systems  Constitutional: Negative.   Respiratory: Negative.    Cardiovascular: Negative.   Musculoskeletal:  Positive for back pain.  Neurological:  Positive for numbness.       Objective:   Physical Exam Constitutional:      Appearance: Normal appearance.  Cardiovascular:     Rate and Rhythm: Normal rate and regular rhythm.     Pulses: Normal pulses.     Heart sounds: Normal heart sounds.  Pulmonary:     Effort: Pulmonary effort is normal.     Breath sounds: Normal breath sounds.  Musculoskeletal:     Comments: The lower back is not tender. He has full flexion of the spine, but extension is limited by pain. SLR is positive on the right   Neurological:     Mental Status: He is alert.           Assessment & Plan:  He has chronic low back pain, now with some radiculopathy to the right leg. We will treat with a Medrol  dose pack. Refer back to Dr. Donaciano Sprang. Garnette Olmsted, MD

## 2024-10-09 NOTE — Telephone Encounter (Signed)
 Pt was seen by Dr Johnny on 10/08/24 for this problem

## 2024-10-16 ENCOUNTER — Ambulatory Visit: Admitting: Family Medicine

## 2024-10-16 ENCOUNTER — Encounter: Payer: Self-pay | Admitting: Family Medicine

## 2024-10-16 VITALS — BP 128/80 | HR 98 | Temp 98.8°F | Ht 69.0 in | Wt 191.0 lb

## 2024-10-16 DIAGNOSIS — Z23 Encounter for immunization: Secondary | ICD-10-CM | POA: Diagnosis not present

## 2024-10-16 DIAGNOSIS — Z0000 Encounter for general adult medical examination without abnormal findings: Secondary | ICD-10-CM | POA: Diagnosis not present

## 2024-10-16 DIAGNOSIS — E039 Hypothyroidism, unspecified: Secondary | ICD-10-CM

## 2024-10-16 LAB — TSH: TSH: 0.88 u[IU]/mL (ref 0.35–5.50)

## 2024-10-16 LAB — CBC WITH DIFFERENTIAL/PLATELET
Basophils Absolute: 0.1 K/uL (ref 0.0–0.1)
Basophils Relative: 0.7 % (ref 0.0–3.0)
Eosinophils Absolute: 0.4 K/uL (ref 0.0–0.7)
Eosinophils Relative: 3.4 % (ref 0.0–5.0)
HCT: 40.5 % (ref 39.0–52.0)
Hemoglobin: 12.8 g/dL — ABNORMAL LOW (ref 13.0–17.0)
Lymphocytes Relative: 27.2 % (ref 12.0–46.0)
Lymphs Abs: 3.1 K/uL (ref 0.7–4.0)
MCHC: 31.6 g/dL (ref 30.0–36.0)
MCV: 81.2 fl (ref 78.0–100.0)
Monocytes Absolute: 1.3 K/uL — ABNORMAL HIGH (ref 0.1–1.0)
Monocytes Relative: 11.1 % (ref 3.0–12.0)
Neutro Abs: 6.6 K/uL (ref 1.4–7.7)
Neutrophils Relative %: 57.6 % (ref 43.0–77.0)
Platelets: 261 K/uL (ref 150.0–400.0)
RBC: 4.99 Mil/uL (ref 4.22–5.81)
RDW: 14.4 % (ref 11.5–15.5)
WBC: 11.5 K/uL — ABNORMAL HIGH (ref 4.0–10.5)

## 2024-10-16 LAB — HEPATIC FUNCTION PANEL
ALT: 27 U/L (ref 0–53)
AST: 15 U/L (ref 0–37)
Albumin: 4.2 g/dL (ref 3.5–5.2)
Alkaline Phosphatase: 80 U/L (ref 39–117)
Bilirubin, Direct: 0.1 mg/dL (ref 0.0–0.3)
Total Bilirubin: 0.4 mg/dL (ref 0.2–1.2)
Total Protein: 7.1 g/dL (ref 6.0–8.3)

## 2024-10-16 LAB — HEMOGLOBIN A1C: Hgb A1c MFr Bld: 6.6 % — ABNORMAL HIGH (ref 4.6–6.5)

## 2024-10-16 LAB — BASIC METABOLIC PANEL WITH GFR
BUN: 13 mg/dL (ref 6–23)
CO2: 33 meq/L — ABNORMAL HIGH (ref 19–32)
Calcium: 9.5 mg/dL (ref 8.4–10.5)
Chloride: 100 meq/L (ref 96–112)
Creatinine, Ser: 1.34 mg/dL (ref 0.40–1.50)
GFR: 58.21 mL/min — ABNORMAL LOW (ref >60.00)
Glucose, Bld: 81 mg/dL (ref 70–99)
Potassium: 4.2 meq/L (ref 3.5–5.1)
Sodium: 142 meq/L (ref 135–145)

## 2024-10-16 LAB — LIPID PANEL
Cholesterol: 202 mg/dL — ABNORMAL HIGH (ref 0–200)
HDL: 78.2 mg/dL (ref >39.00)
LDL Cholesterol: 107 mg/dL — ABNORMAL HIGH (ref 0–99)
NonHDL: 123.67
Total CHOL/HDL Ratio: 3
Triglycerides: 83 mg/dL (ref 0.0–149.0)
VLDL: 16.6 mg/dL (ref 0.0–40.0)

## 2024-10-16 LAB — T3, FREE: T3, Free: 3.2 pg/mL (ref 2.3–4.2)

## 2024-10-16 LAB — PSA: PSA: 0.79 ng/mL (ref 0.10–4.00)

## 2024-10-16 LAB — T4, FREE: Free T4: 0.71 ng/dL (ref 0.60–1.60)

## 2024-10-16 MED ORDER — METOPROLOL SUCCINATE ER 50 MG PO TB24: 50.0000 mg | ORAL_TABLET | Freq: Every day | ORAL | 3 refills | Status: AC

## 2024-10-16 NOTE — Addendum Note (Signed)
 Addended by: LADONNA INOCENTE SAILOR on: 10/16/2024 04:46 PM   Modules accepted: Orders

## 2024-10-16 NOTE — Progress Notes (Signed)
 Subjective:    Patient ID: Curtistine DELENA Lesches, male    DOB: 10/23/1966, 58 y.o.   MRN: [id number]  HPI Here for a well exam. He is doing well. We recently saw him for some low back pain. We treated this with a Medrol  dose pack, and this has resolved.    Review of Systems  Constitutional: Negative.   HENT: Negative.    Eyes: Negative.   Respiratory: Negative.    Cardiovascular: Negative.   Gastrointestinal: Negative.   Genitourinary: Negative.   Musculoskeletal: Negative.   Skin: Negative.   Neurological: Negative.   Psychiatric/Behavioral: Negative.         Objective:   Physical Exam Constitutional:      General: He is not in acute distress.    Appearance: Normal appearance. He is well-developed. He is not diaphoretic.  HENT:     Head: Normocephalic and atraumatic.     Right Ear: External ear normal.     Left Ear: External ear normal.     Nose: Nose normal.     Mouth/Throat:     Pharynx: No oropharyngeal exudate.  Eyes:     General: No scleral icterus.       Right eye: No discharge.        Left eye: No discharge.     Conjunctiva/sclera: Conjunctivae normal.     Pupils: Pupils are equal, round, and reactive to light.  Neck:     Thyroid : No thyromegaly.     Vascular: No JVD.     Trachea: No tracheal deviation.  Cardiovascular:     Rate and Rhythm: Normal rate and regular rhythm.     Pulses: Normal pulses.     Heart sounds: Normal heart sounds. No murmur heard.    No friction rub. No gallop.  Pulmonary:     Effort: Pulmonary effort is normal. No respiratory distress.     Breath sounds: Normal breath sounds. No wheezing or rales.  Chest:     Chest wall: No tenderness.  Abdominal:     General: Bowel sounds are normal. There is no distension.     Palpations: Abdomen is soft. There is no mass.     Tenderness: There is no abdominal tenderness. There is no guarding or rebound.  Genitourinary:    Penis: Normal. No tenderness.      Testes: Normal.     Prostate:  Normal.     Rectum: Normal. Guaiac result negative.  Musculoskeletal:        General: No tenderness. Normal range of motion.     Cervical back: Neck supple.  Lymphadenopathy:     Cervical: No cervical adenopathy.  Skin:    General: Skin is warm and dry.     Coloration: Skin is not pale.     Findings: No erythema or rash.  Neurological:     General: No focal deficit present.     Mental Status: He is alert and oriented to person, place, and time.     Cranial Nerves: No cranial nerve deficit.     Motor: No abnormal muscle tone.     Coordination: Coordination normal.     Deep Tendon Reflexes: Reflexes are normal and symmetric. Reflexes normal.  Psychiatric:        Mood and Affect: Mood normal.        Behavior: Behavior normal.        Thought Content: Thought content normal.        Judgment: Judgment normal.  Assessment & Plan:  Well exam. We discussed diet and exercise. Get fasting labs. Garnette Olmsted, MD

## 2024-10-17 ENCOUNTER — Ambulatory Visit: Payer: Self-pay | Admitting: Family Medicine

## 2024-10-17 ENCOUNTER — Encounter: Payer: Self-pay | Admitting: Family Medicine

## 2024-10-17 DIAGNOSIS — R7303 Prediabetes: Secondary | ICD-10-CM | POA: Insufficient documentation

## 2025-01-15 ENCOUNTER — Other Ambulatory Visit
